# Patient Record
Sex: Female | Born: 1960 | Race: White | Hispanic: No | Smoking: Current every day smoker
Health system: Southern US, Community
[De-identification: ages and names within clinical notes are randomized; demographics above are authoritative.]

## PROBLEM LIST (undated history)

## (undated) DIAGNOSIS — M1990 Unspecified osteoarthritis, unspecified site: Secondary | ICD-10-CM

## (undated) DIAGNOSIS — F419 Anxiety disorder, unspecified: Secondary | ICD-10-CM

## (undated) DIAGNOSIS — E119 Type 2 diabetes mellitus without complications: Secondary | ICD-10-CM

## (undated) DIAGNOSIS — F32A Depression, unspecified: Secondary | ICD-10-CM

## (undated) DIAGNOSIS — E786 Lipoprotein deficiency: Secondary | ICD-10-CM

## (undated) DIAGNOSIS — K219 Gastro-esophageal reflux disease without esophagitis: Secondary | ICD-10-CM

## (undated) DIAGNOSIS — F329 Major depressive disorder, single episode, unspecified: Secondary | ICD-10-CM

## (undated) DIAGNOSIS — K7581 Nonalcoholic steatohepatitis (NASH): Secondary | ICD-10-CM

## (undated) DIAGNOSIS — N3941 Urge incontinence: Secondary | ICD-10-CM

## (undated) HISTORY — PX: CARPAL TUNNEL RELEASE: SHX101

## (undated) HISTORY — PX: OTHER SURGICAL HISTORY: SHX169

## (undated) HISTORY — PX: APPENDECTOMY: SHX54

---

## 2004-09-23 ENCOUNTER — Ambulatory Visit: Payer: Self-pay | Admitting: Family Medicine

## 2005-08-17 ENCOUNTER — Encounter: Payer: Self-pay | Admitting: Family Medicine

## 2005-08-21 ENCOUNTER — Encounter: Payer: Self-pay | Admitting: Family Medicine

## 2005-09-20 ENCOUNTER — Encounter: Payer: Self-pay | Admitting: Family Medicine

## 2006-05-06 ENCOUNTER — Ambulatory Visit: Payer: Self-pay | Admitting: Family Medicine

## 2008-05-07 ENCOUNTER — Ambulatory Visit: Payer: Self-pay | Admitting: Gastroenterology

## 2011-01-06 ENCOUNTER — Ambulatory Visit: Payer: Self-pay | Admitting: Family Medicine

## 2012-07-12 DIAGNOSIS — G5602 Carpal tunnel syndrome, left upper limb: Secondary | ICD-10-CM | POA: Insufficient documentation

## 2012-07-12 DIAGNOSIS — F321 Major depressive disorder, single episode, moderate: Secondary | ICD-10-CM | POA: Insufficient documentation

## 2012-07-12 DIAGNOSIS — E785 Hyperlipidemia, unspecified: Secondary | ICD-10-CM | POA: Insufficient documentation

## 2012-07-12 DIAGNOSIS — G4700 Insomnia, unspecified: Secondary | ICD-10-CM | POA: Insufficient documentation

## 2013-03-24 ENCOUNTER — Ambulatory Visit: Payer: Self-pay | Admitting: Gastroenterology

## 2013-12-26 ENCOUNTER — Ambulatory Visit: Payer: Self-pay | Admitting: Family Medicine

## 2013-12-26 IMAGING — MG MM DIGITAL SCREENING BILAT W/ CAD
1 series · 4 of 4 positions shown · non-contrast
Comparison: Previous exam(s).

CLINICAL DATA: Screening.

EXAM:
DIGITAL SCREENING BILATERAL MAMMOGRAM WITH CAD

[R CC · right · 4 of 4 slices shown]
[im 1/4]
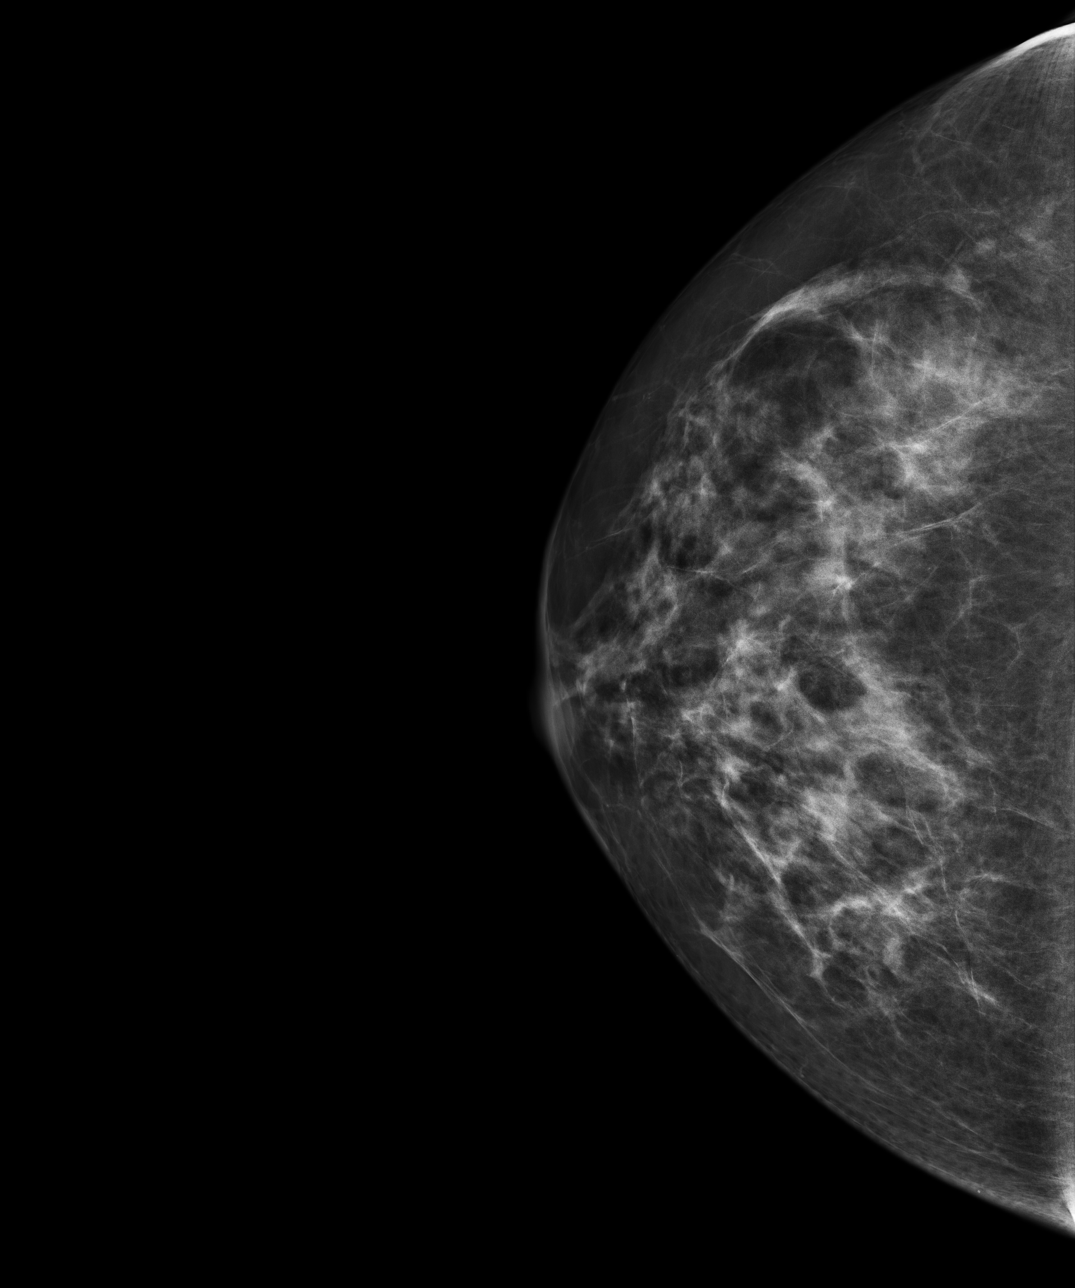
[im 2/4]
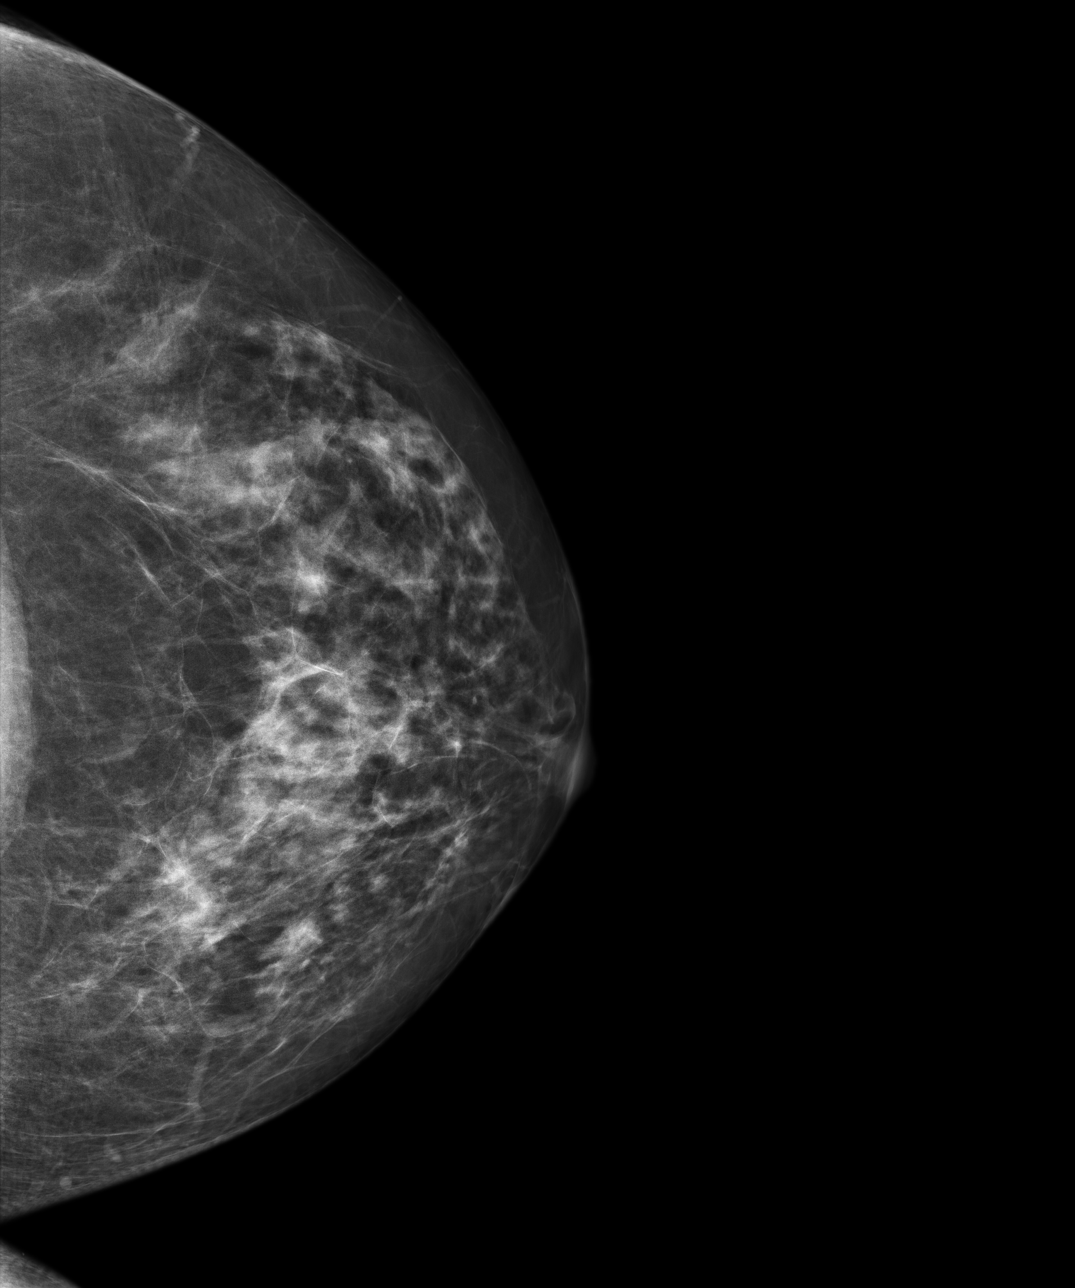
[im 3/4]
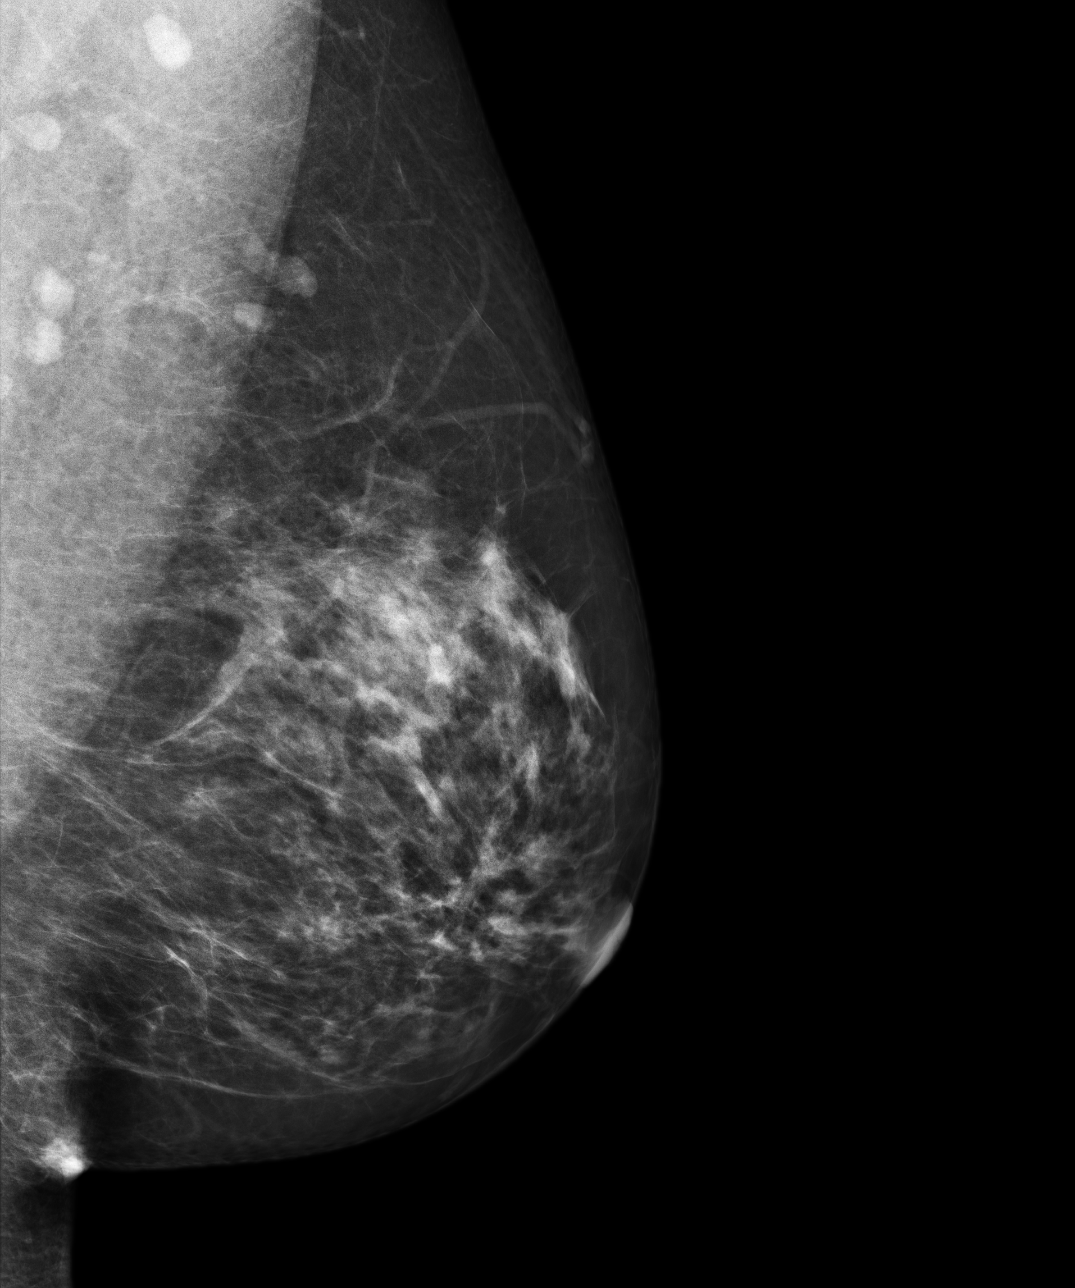
[im 4/4]
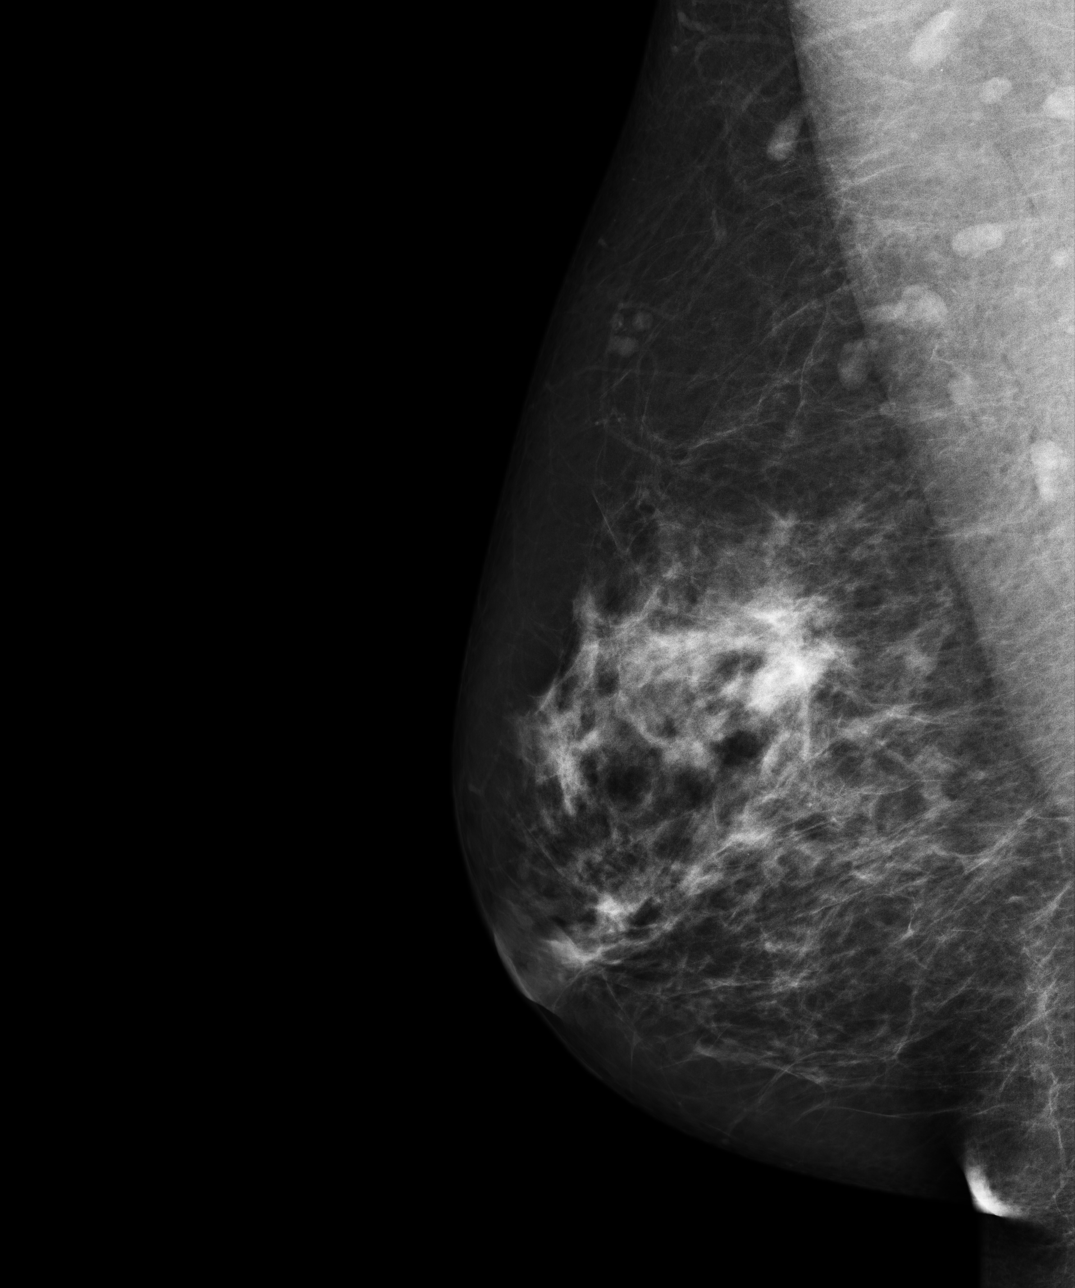

[4 of 4 positions shown; findings below may reference images not displayed]

ACR Breast Density Category b: There are scattered areas of
fibroglandular density.
FINDINGS: There are no findings suspicious for malignancy. Images were
processed with CAD.
IMPRESSION: No mammographic evidence of malignancy. A result letter of this
screening mammogram will be mailed directly to the patient.

RECOMMENDATION:
Screening mammogram in one year. (Code:[HN])

BI-RADS CATEGORY  1: Negative

## 2014-03-20 ENCOUNTER — Ambulatory Visit: Payer: Self-pay | Admitting: Obstetrics & Gynecology

## 2014-03-20 DIAGNOSIS — E7800 Pure hypercholesterolemia, unspecified: Secondary | ICD-10-CM

## 2014-03-20 LAB — CBC
HCT: 38.7 % (ref 35.0–47.0)
HGB: 13.2 g/dL (ref 12.0–16.0)
MCH: 33.1 pg (ref 26.0–34.0)
MCHC: 34 g/dL (ref 32.0–36.0)
MCV: 97 fL (ref 80–100)
Platelet: 242 10*3/uL (ref 150–440)
RBC: 3.97 10*6/uL (ref 3.80–5.20)
RDW: 12 % (ref 11.5–14.5)
WBC: 7.7 10*3/uL (ref 3.6–11.0)

## 2014-03-20 LAB — PROTIME-INR
INR: 0.9
Prothrombin Time: 11.8 secs (ref 11.5–14.7)

## 2014-03-20 LAB — APTT: ACTIVATED PTT: 29.2 s (ref 23.6–35.9)

## 2014-03-30 ENCOUNTER — Ambulatory Visit: Payer: Self-pay | Admitting: Obstetrics & Gynecology

## 2015-04-13 NOTE — Op Note (Signed)
PATIENT NAME:  Melanie Morales, Melanie Morales MR#:  [id number] DATE OF BIRTH:  March 23, 1961  DATE OF PROCEDURE:  03/30/2014  PREOPERATIVE DIAGNOSIS: Genuine stress urinary incontinence and cystocele.  POSTOPERATIVE DIAGNOSIS: Genuine stress urinary incontinence and cystocele.   PROCEDURE: Anterior colporrhaphy with vaginal sling procedure for incontinence and cystoscopy.   SURGEON: Glean Salen, MD  ANESTHESIA: General.   ESTIMATED BLOOD LOSS: Minimal.   COMPLICATIONS: None.   FINDINGS: Cystocele.   DISPOSITION: To recovery room stable.   TECHNIQUE: The patient is prepped and draped in the usual sterile fashion after adequate anesthesia is obtained in the dorsal lithotomy position. A Foley catheter is inserted. A speculum is placed, and the cystocele is visualized and identified. The cervix and uterus are identified with good distal support. Allis clamps are placed along the vaginal anterior wall midline, and then 1% lidocaine with epinephrine is used to infiltrate the midline space. A scalpel is used to create an incision in the vaginal mucosa, and then the dissection is carried out in a vertical superior to inferior fashion using Metzenbaum scissors. The endopelvic fascia is dissected away from the vaginal mucosa with careful dissection towards the retropubic space. Once adequate dissection is performed, then the Foley catheter bag is removed from the Foley, and a rigid catheter guide is placed through the Foley to deviate the bladder in a contralateral position from where the trocars are placed for the vaginal sling. Using a long spinal needle, 1% lidocaine with epinephrine is used to infiltrate the area from the pubic ramus up to the skin in the suprapubic mons region for placement of trocars. Using the TVT Exact, trocar is placed vaginally through the dissected space into the retropubic space and then guided upwards towards the exit point previously identified. A skin nick with a scalpel is used to pull  the trocar through. This is performed on the contralateral side as well.   Cystoscopy is performed with saline distention of the bladder. The catheter and rigid guide are removed as the cystoscopy is placed, and 250 mL is infiltrated into the bladder. Visualization reveals no injury or trocar perforation of the bladder. The cystoscope was removed, but the fluid is left in the bladder at this time.   The trocars are pulled superiorly and anteriorly so that the sling is placed up against the bladder neck in the vaginal dissection site. Tension is placed, and then Valsalva with suprapubic pressure is performed until minimal leakage is performed. It is carefully noted not to completely obstruct the urethra, and a hemostat is easily able to be placed between the tension-free tape sling and the tissues. The sheath over the sling is then removed, and the sling is cut off at the level of the skin in the mons pubis area, and the skin is closed with Dermabond.   Foley catheter is reinserted. Colporrhaphy sutures are applied with 0 Vicryls, incorporating the endopelvic fascia to overlie the sling as well as in this anterior vaginal wall area to strengthen the fascia. Excess vaginal mucosa is excised. The vaginal mucosa is then closed with a running 2-0 Vicryl suture in a locking fashion. Vaginal cavity is irrigated with saline. A packing sponge with bacitracin ointment applied is then placed vaginally until the patient can get to the recovery area. The patient has a Foley catheter as she goes to recovery room, with later plans for a voiding trial. The patient tolerated the procedure well and goes to recovery room in stable condition. All sponge, instrument and needle  counts are correct at the conclusion of the case.     ____________________________ R. Barnett Applebaum, MD rph:lb D: 03/30/2014 11:32:22 ET T: 03/30/2014 11:40:25 ET JOB#: [id number]  cc: Glean Salen, MD, <Dictator> Gae Dry MD ELECTRONICALLY  SIGNED 03/31/2014 8:26

## 2015-04-17 ENCOUNTER — Other Ambulatory Visit: Payer: Self-pay | Admitting: Family Medicine

## 2015-04-17 DIAGNOSIS — Z1231 Encounter for screening mammogram for malignant neoplasm of breast: Secondary | ICD-10-CM

## 2015-04-17 DIAGNOSIS — Z13820 Encounter for screening for osteoporosis: Secondary | ICD-10-CM

## 2015-05-07 ENCOUNTER — Ambulatory Visit
Admission: RE | Admit: 2015-05-07 | Discharge: 2015-05-07 | Disposition: A | Discharge status: Home / Self Care | Payer: Private Health Insurance - Indemnity | Source: Ambulatory Visit | Attending: Family Medicine | Admitting: Family Medicine

## 2015-05-07 DIAGNOSIS — Z13820 Encounter for screening for osteoporosis: Secondary | ICD-10-CM

## 2015-05-07 DIAGNOSIS — Z1231 Encounter for screening mammogram for malignant neoplasm of breast: Secondary | ICD-10-CM | POA: Insufficient documentation

## 2015-05-07 IMAGING — MG MM DIGITAL SCREENING BILAT W/ CAD
4 series · 4 of 4 positions shown · non-contrast
Comparison: Previous exam(s).

CLINICAL DATA: Screening.

EXAM:
DIGITAL SCREENING BILATERAL MAMMOGRAM WITH CAD

[L CC]
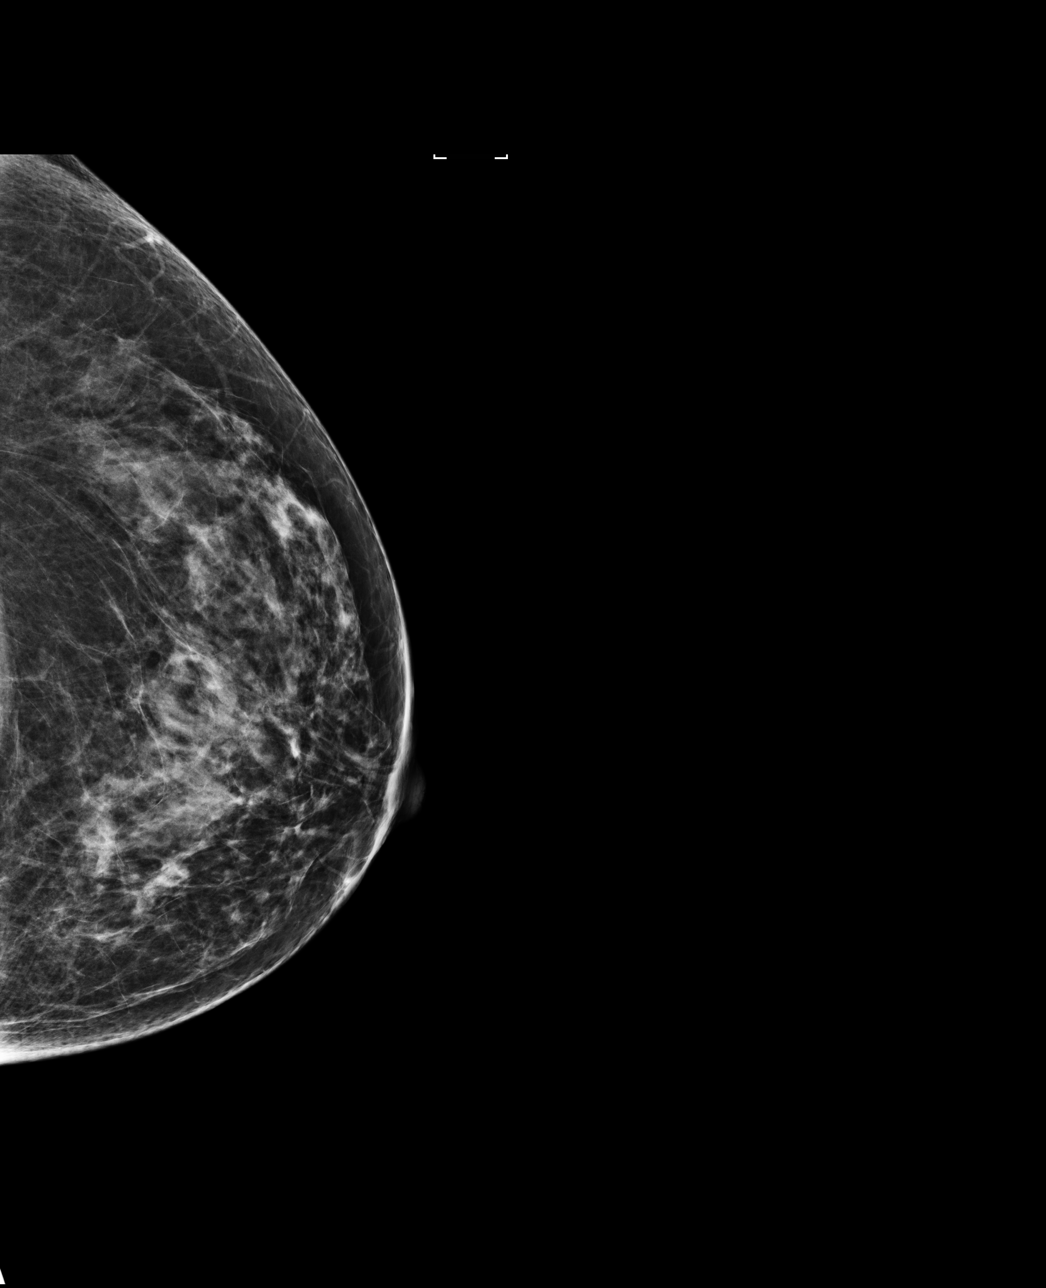

[R CC]
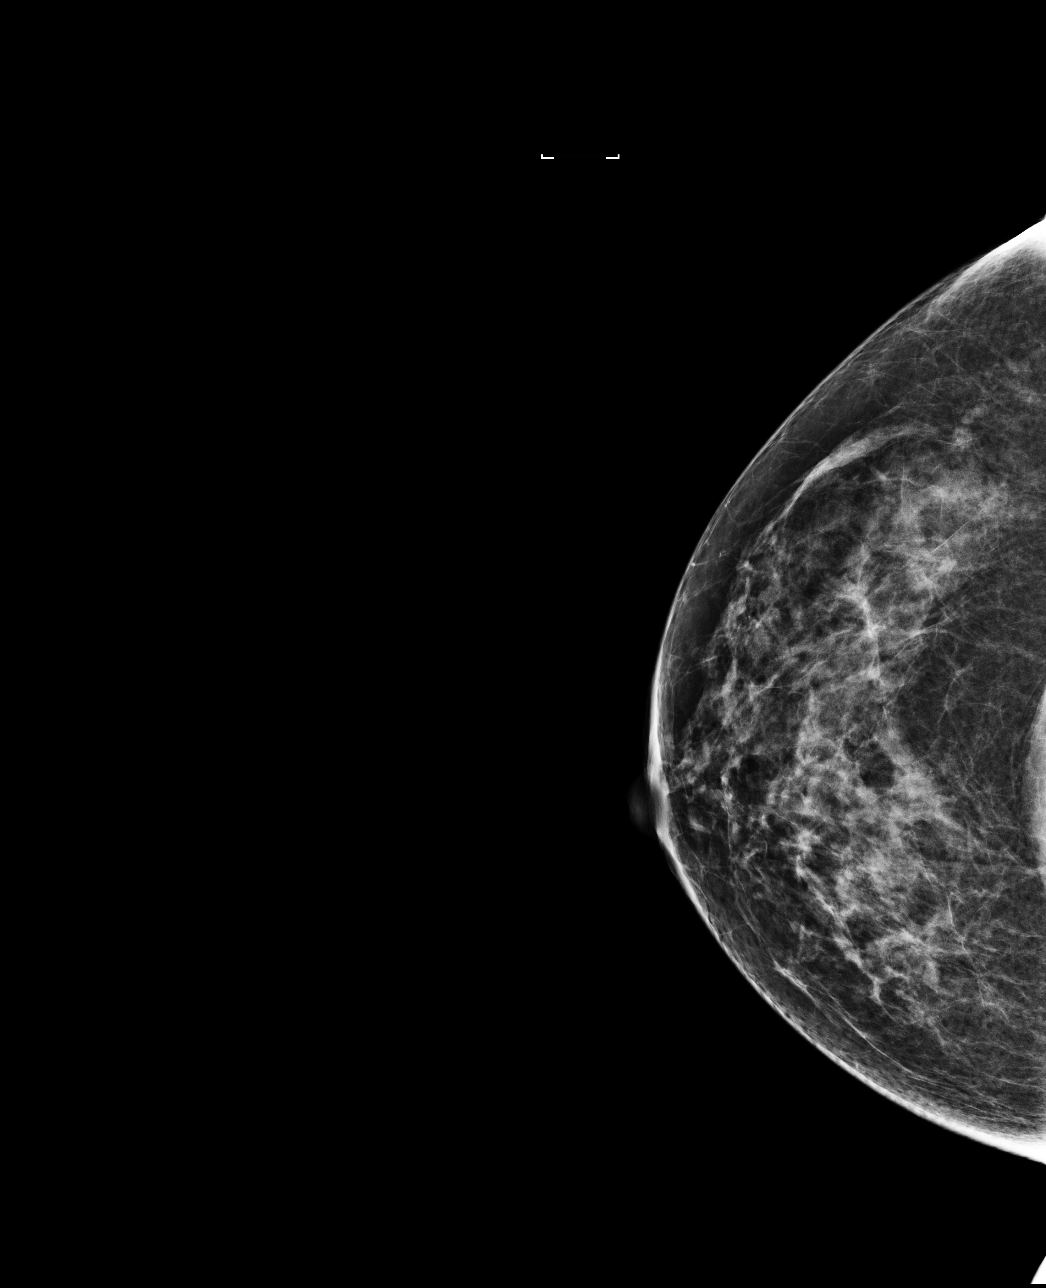

[R MLO]
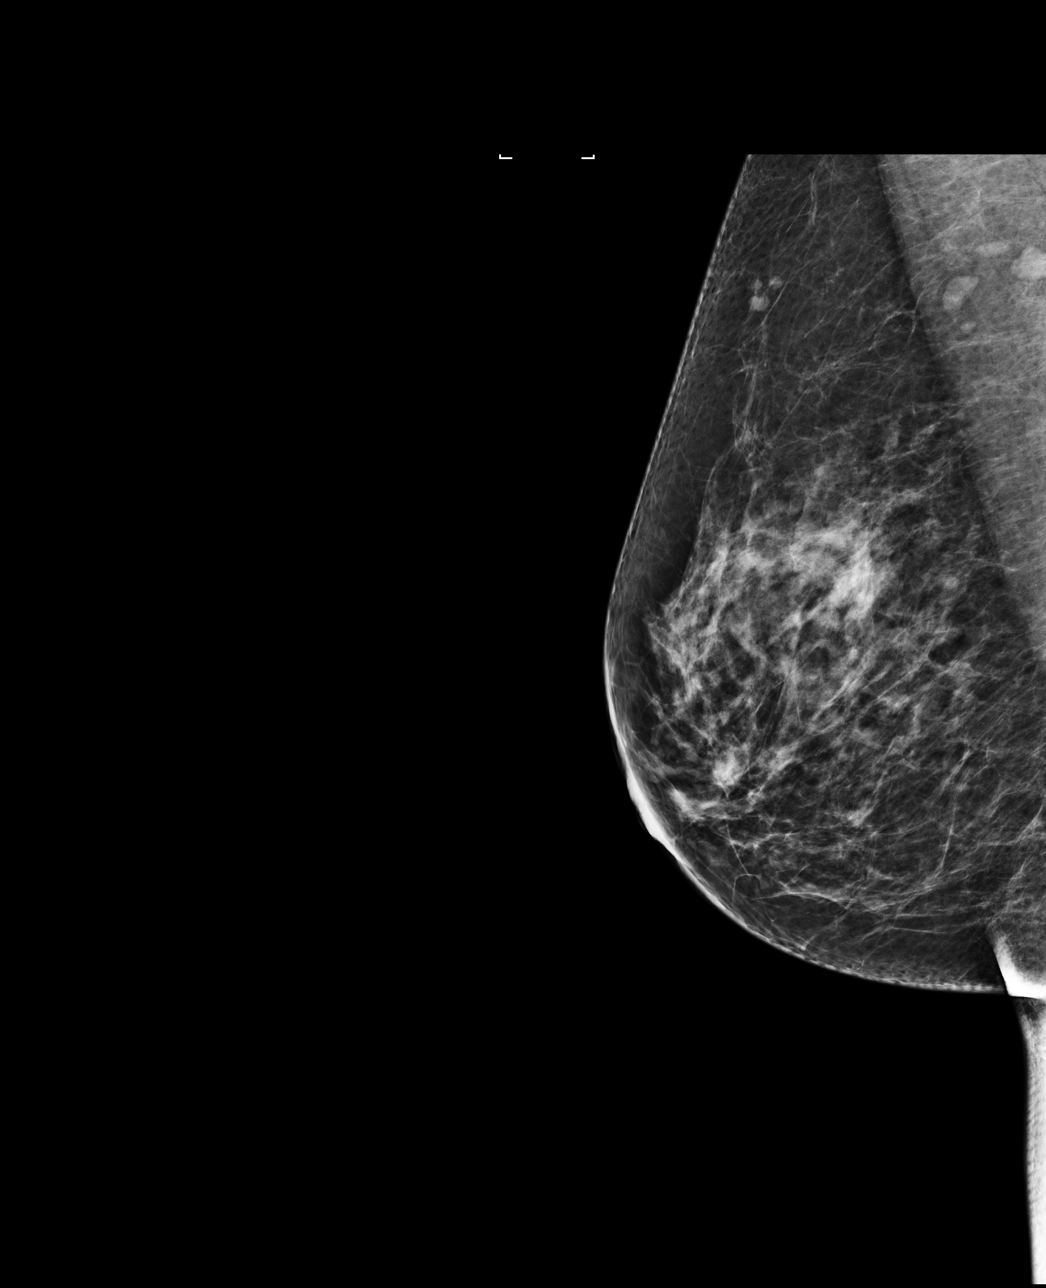

[L MLO]
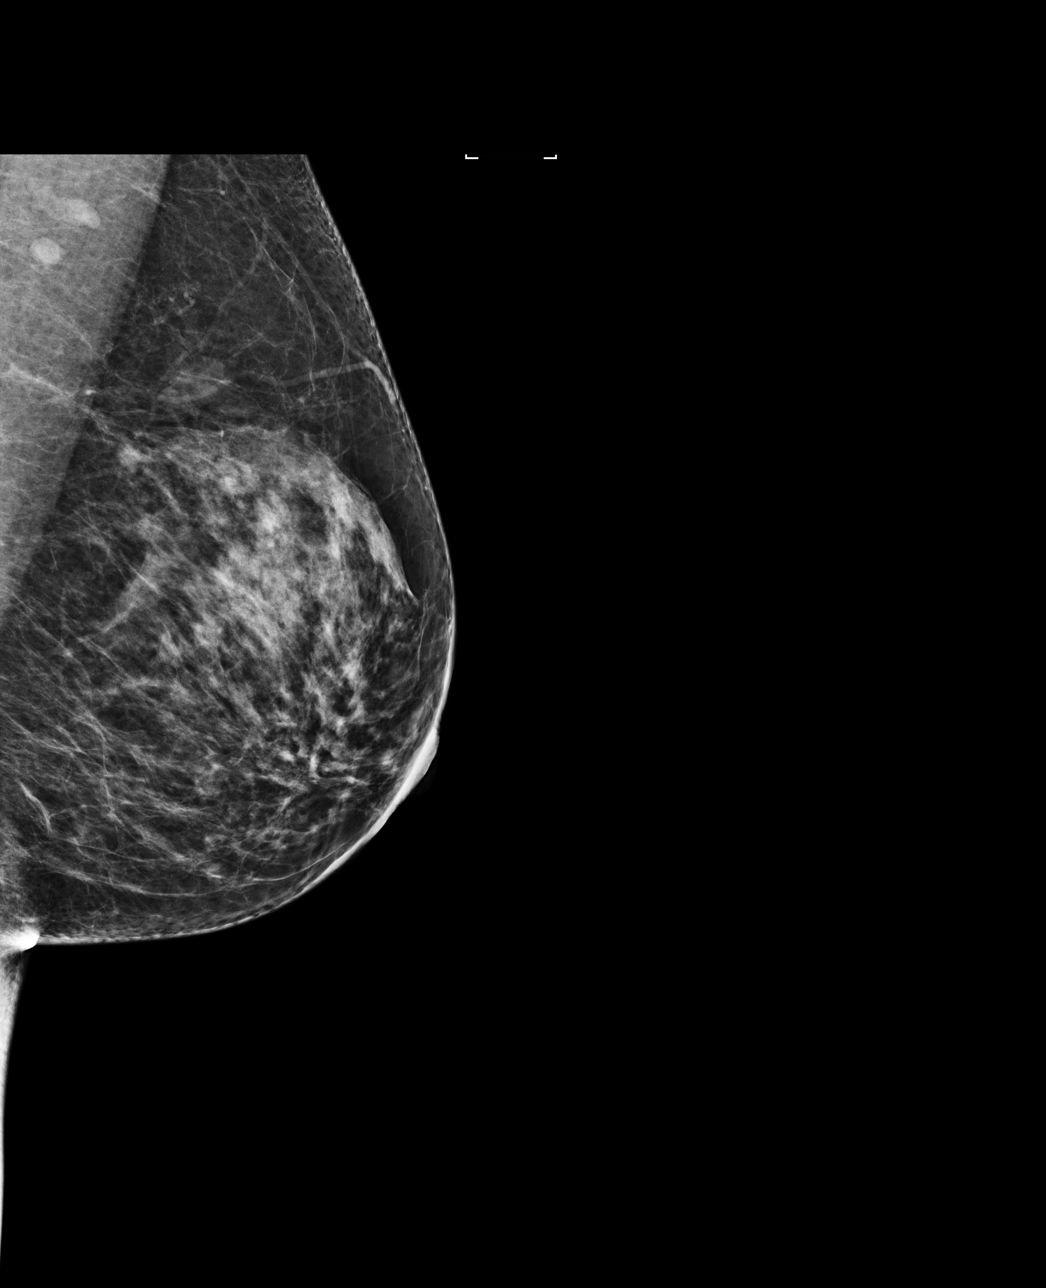

[4 of 4 positions shown; findings below may reference images not displayed]

ACR Breast Density Category c: The breast tissue is heterogeneously
dense, which may obscure small masses.
FINDINGS: There are no findings suspicious for malignancy. Images were
processed with CAD.
IMPRESSION: No mammographic evidence of malignancy. A result letter of this
screening mammogram will be mailed directly to the patient.

RECOMMENDATION:
Screening mammogram in one year. (Code:[0J])

BI-RADS CATEGORY  1: Negative.

## 2015-07-25 ENCOUNTER — Encounter: Payer: Self-pay | Admitting: *Deleted

## 2015-07-26 ENCOUNTER — Encounter: Payer: Self-pay | Admitting: *Deleted

## 2015-07-26 ENCOUNTER — Ambulatory Visit
Admission: RE | Admit: 2015-07-26 | Discharge: 2015-07-26 | Disposition: A | Discharge status: Home / Self Care | Payer: Managed Care, Other (non HMO) | Source: Ambulatory Visit | Attending: Gastroenterology | Admitting: Gastroenterology

## 2015-07-26 ENCOUNTER — Ambulatory Visit: Payer: Managed Care, Other (non HMO) | Admitting: Certified Registered Nurse Anesthetist

## 2015-07-26 ENCOUNTER — Encounter
Admission: RE | Disposition: A | Discharge status: Home / Self Care | Payer: Self-pay | Source: Ambulatory Visit | Attending: Gastroenterology

## 2015-07-26 DIAGNOSIS — Z800 Family history of malignant neoplasm of digestive organs: Secondary | ICD-10-CM | POA: Diagnosis not present

## 2015-07-26 DIAGNOSIS — K219 Gastro-esophageal reflux disease without esophagitis: Secondary | ICD-10-CM | POA: Diagnosis present

## 2015-07-26 DIAGNOSIS — Z7951 Long term (current) use of inhaled steroids: Secondary | ICD-10-CM | POA: Insufficient documentation

## 2015-07-26 DIAGNOSIS — Z79899 Other long term (current) drug therapy: Secondary | ICD-10-CM | POA: Diagnosis not present

## 2015-07-26 DIAGNOSIS — E119 Type 2 diabetes mellitus without complications: Secondary | ICD-10-CM | POA: Insufficient documentation

## 2015-07-26 DIAGNOSIS — F329 Major depressive disorder, single episode, unspecified: Secondary | ICD-10-CM | POA: Diagnosis not present

## 2015-07-26 DIAGNOSIS — K2980 Duodenitis without bleeding: Secondary | ICD-10-CM | POA: Insufficient documentation

## 2015-07-26 HISTORY — DX: Major depressive disorder, single episode, unspecified: F32.9

## 2015-07-26 HISTORY — DX: Lipoprotein deficiency: E78.6

## 2015-07-26 HISTORY — DX: Gastro-esophageal reflux disease without esophagitis: K21.9

## 2015-07-26 HISTORY — DX: Depression, unspecified: F32.A

## 2015-07-26 HISTORY — PX: ESOPHAGOGASTRODUODENOSCOPY (EGD) WITH PROPOFOL: SHX5813

## 2015-07-26 LAB — GLUCOSE, CAPILLARY: GLUCOSE-CAPILLARY: 88 mg/dL (ref 65–99)

## 2015-07-26 LAB — POCT PREGNANCY, URINE: Preg Test, Ur: NEGATIVE

## 2015-07-26 SURGERY — ESOPHAGOGASTRODUODENOSCOPY (EGD) WITH PROPOFOL
Anesthesia: General

## 2015-07-26 MED ORDER — PROPOFOL INFUSION 10 MG/ML OPTIME
INTRAVENOUS | Status: DC | PRN
  Administered 2015-07-26: 100 ug/kg/min via INTRAVENOUS

## 2015-07-26 MED ORDER — SODIUM CHLORIDE 0.9 % IV SOLN
INTRAVENOUS | Status: DC
  Administered 2015-07-26: 1000 mL via INTRAVENOUS

## 2015-07-26 MED ORDER — MIDAZOLAM HCL 2 MG/2ML IJ SOLN
INTRAMUSCULAR | Status: DC | PRN
  Administered 2015-07-26: 1 mg via INTRAVENOUS

## 2015-07-26 MED ORDER — PROPOFOL 10 MG/ML IV BOLUS
INTRAVENOUS | Status: DC | PRN
  Administered 2015-07-26: 20 mg via INTRAVENOUS

## 2015-07-26 MED ORDER — GLYCOPYRROLATE 0.2 MG/ML IJ SOLN
INTRAMUSCULAR | Status: DC | PRN
  Administered 2015-07-26: 0.2 mg via INTRAVENOUS

## 2015-07-26 MED ORDER — LIDOCAINE HCL (CARDIAC) 20 MG/ML IV SOLN: INTRAVENOUS | Status: DC | PRN

## 2015-07-26 MED ORDER — SODIUM CHLORIDE 0.9 % IV SOLN: INTRAVENOUS | Status: DC

## 2015-07-26 MED ORDER — FENTANYL CITRATE (PF) 100 MCG/2ML IJ SOLN
INTRAMUSCULAR | Status: DC | PRN
  Administered 2015-07-26: 25 ug via INTRAVENOUS

## 2015-07-26 MED ORDER — PROPOFOL 10 MG/ML IV BOLUS: INTRAVENOUS | Status: DC | PRN

## 2015-07-26 MED ORDER — PROPOFOL INFUSION 10 MG/ML OPTIME: INTRAVENOUS | Status: DC | PRN

## 2015-07-26 MED ORDER — LIDOCAINE HCL (CARDIAC) 20 MG/ML IV SOLN
INTRAVENOUS | Status: DC | PRN
  Administered 2015-07-26: 100 mg via INTRAVENOUS

## 2015-07-26 NOTE — Anesthesia Postprocedure Evaluation (Signed)
  Anesthesia Post-op Note  Patient: Melanie Morales  Procedure(s) Performed: Procedure(s): ESOPHAGOGASTRODUODENOSCOPY (EGD) WITH PROPOFOL (N/A)  Anesthesia type:General  Patient location: PACU  Post pain: Pain level controlled  Post assessment: Post-op Vital signs reviewed, Patient's Cardiovascular Status Stable, Respiratory Function Stable, Patent Airway and No signs of Nausea or vomiting  Post vital signs: Reviewed and stable  Last Vitals:  Filed Vitals:   07/26/15 1237  BP: 121/64  Pulse: 79  Temp: 36.3 C  Resp: 16    Level of consciousness: awake, alert  and patient cooperative  Complications: No apparent anesthesia complications

## 2015-07-26 NOTE — Op Note (Signed)
[facility] Gastroenterology Patient Name: Biviana Saddler Procedure Date: 07/26/2015 11:56 AM MRN: [id number] Account #: [id number] Date of Birth: 06-01-1961 Admit Type: Outpatient Age: 54 Room: [facility] ENDO ROOM 3 Gender: Female Note Status: Finalized Procedure:         Upper GI endoscopy Indications:       Gastro-esophageal reflux disease, Follow-up of Barrett's                     esophagus Providers:         Lollie Sails, MD Referring MD:      Truitt Leep. Ingledue (Referring MD) Medicines:         Monitored Anesthesia Care Complications:     No immediate complications. Procedure:         Pre-Anesthesia Assessment:                    - ASA Grade Assessment: II - A patient with mild systemic                     disease.                    After obtaining informed consent, the endoscope was passed                     under direct vision. Throughout the procedure, the                     patient's blood pressure, pulse, and oxygen saturations                     were monitored continuously. The Olympus GIF-160 endoscope                     (S#. [id number]) was introduced through the mouth, and                     advanced to the fourth part of duodenum. The upper GI                     endoscopy was accomplished without difficulty. The patient                     tolerated the procedure well. Findings:      The Z-line was variable and was found 39 cm from the incisors. Biopsies       were taken with a cold forceps for histology.      Tongues of salmon-colored mucosa were present. The maximum longitudinal       extent of these esophageal mucosal changes was 1 cm in length.      The exam of the esophagus was otherwise normal.      The entire examined stomach was normal.      The cardia and gastric fundus were normal on retroflexion.      Patchy mild mucosal variance characterized by texture change was found       in the second part of the duodenum and in the third  part of the       duodenum. Biopsies were taken with a cold forceps for histology. Impression:        - Z-line variable, 39 cm from the incisors. Biopsied.                    - Salmon-colored  mucosa suggestive of short-segment                     Barrett's esophagus.                    - Normal stomach.                    - Mucosal variant in the duodenum. Biopsied. Recommendation:    - Return to GI clinic in 1 month.                    - Discharge patient to home.                    - Continue present medications. Procedure Code(s): --- Professional ---                    [phone], Esophagogastroduodenoscopy, flexible, transoral;                     with biopsy, single or multiple Diagnosis Code(s): --- Professional ---                    530.89, Other specified disorders of esophagus                    530.85, Barrett's esophagus                    537.9, Unspecified disorder of stomach and duodenum                    530.81, Esophageal reflux CPT copyright 2014 American Medical Association. All rights reserved. The codes documented in this report are preliminary and upon coder review may  be revised to meet current compliance requirements. Lollie Sails, MD 07/26/2015 12:37:49 PM This report has been signed electronically. Number of Addenda: 0 Note Initiated On: 07/26/2015 11:56 AM      [facility]

## 2015-07-26 NOTE — Transfer of Care (Signed)
Immediate Anesthesia Transfer of Care Note  Patient: Melanie Morales  Procedure(s) Performed: Procedure(s): ESOPHAGOGASTRODUODENOSCOPY (EGD) WITH PROPOFOL (N/A)  Patient Location: PACU  Anesthesia Type:General  Level of Consciousness: awake, alert  and oriented  Airway & Oxygen Therapy: Patient Spontanous Breathing  Post-op Assessment: Report given to RN and Post -op Vital signs reviewed and stable  Post vital signs: Reviewed, stable   Last Vitals:  Filed Vitals:   07/26/15 1236  BP: 121/64  Pulse: 77  Temp: 36.3 C  Resp: 14    Complications: No apparent anesthesia complications

## 2015-07-26 NOTE — H&P (Addendum)
Outpatient short stay form Pre-procedure 07/26/2015 12:01 PM Melanie Sails MD  Primary Physician: Dr. Kirkland Hun  Reason for visit:  EGD  History of present illness:  Melanie Morales is a 54 year old female with a history of an EGD being done in May 2009. At that time she was told she had Barrett's esophagus. Had a repeat EGD since that time. She has no dysphagia does take omeprazole 20 mg daily.  She does have a primary relative, her brother, who did have esophageal cancer at the age of about 60.    Current facility-administered medications:  .  0.9 %  sodium chloride infusion, , Intravenous, Continuous, Melanie Sails, MD, Last Rate: 20 mL/hr at 07/26/15 1052, 1,000 mL at 07/26/15 1052 .  0.9 %  sodium chloride infusion, , Intravenous, Continuous, Melanie Sails, MD .  0.9 %  sodium chloride infusion, , Intravenous, Continuous, Melanie Sails, MD  Facility-Administered Medications Ordered in Other Encounters:  .  glycopyrrolate (ROBINUL) injection, , , Anesthesia Intra-op, Demetrius Charity, CRNA, 0.2 mg at 07/26/15 1142  Prescriptions prior to admission  Medication Sig Dispense Refill Last Dose  . amitriptyline (ELAVIL) 100 MG tablet Take 100 mg by mouth at bedtime.   07/25/2015 at Unknown time  . FLUoxetine (PROZAC) 20 MG capsule Take 20 mg by mouth daily.   07/25/2015 at Unknown time  . fluticasone (FLONASE) 50 MCG/ACT nasal spray Place 1 spray into both nostrils daily.   07/25/2015 at Unknown time  . lovastatin (MEVACOR) 40 MG tablet Take 40 mg by mouth at bedtime.   07/25/2015 at Unknown time  . metFORMIN (GLUCOPHAGE-XR) 500 MG 24 hr tablet Take 500 mg by mouth daily with breakfast.   07/25/2015 at Unknown time  . omeprazole (PRILOSEC) 20 MG capsule Take 20 mg by mouth daily.   07/25/2015 at Unknown time  . acetaminophen-codeine (TYLENOL #3) 300-30 MG per tablet Take by mouth every 4 (four) hours as needed for moderate pain.   Not Taking at Unknown time  . pravastatin (PRAVACHOL) 20 MG  tablet Take 20 mg by mouth daily.   Not Taking at Unknown time     No Known Allergies   Past Medical History  Diagnosis Date  . 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 GERD (gastroesophageal reflux disease)   . Depression     Review of systems:      Physical Exam    Heart and lungs: Regular rate and rhythm without rub or gallop lungs are bilaterally clear    HEENT: Normocephalic atraumatic eyes are anicteric    Other:     Pertinant exam for procedure: Soft nontender nondistended bowel sounds positive normoactive    Planned proceedures: EGD and indicated procedures    Melanie Sails, MD Gastroenterology 07/26/2015  12:01 PM      I have discussed the risks benefits and complications of procedures to include not limited to bleeding, infection, perforation and the risk of sedation and the patient wishes to proceed.

## 2015-07-26 NOTE — Anesthesia Preprocedure Evaluation (Addendum)
Anesthesia Evaluation  Patient identified by MRN, date of birth, ID band Patient awake    Reviewed: Allergy & Precautions, NPO status , Patient's Chart, lab work & pertinent test results  Airway Mallampati: II  TM Distance: >3 FB Neck ROM: Full    Dental  (+) Teeth Intact   Pulmonary Current Smoker,    Pulmonary exam normal       Cardiovascular Exercise Tolerance: Good Normal cardiovascular exam    Neuro/Psych    GI/Hepatic GERD-  Medicated,Hx of Barrett's esophagus.   Endo/Other  diabetes, Type 2, Oral Hypoglycemic AgentsBG 88.  Renal/GU      Musculoskeletal   Abdominal   Peds  Hematology   Anesthesia Other Findings   Reproductive/Obstetrics                           Anesthesia Physical Anesthesia Plan  ASA: III  Anesthesia Plan: General   Post-op Pain Management:    Induction: Intravenous  Airway Management Planned: Nasal Cannula  Additional Equipment:   Intra-op Plan:   Post-operative Plan:   Informed Consent: I have reviewed the patients History and Physical, chart, labs and discussed the procedure including the risks, benefits and alternatives for the proposed anesthesia with the patient or authorized representative who has indicated his/her understanding and acceptance.     Plan Discussed with: CRNA  Anesthesia Plan Comments:         Anesthesia Quick Evaluation

## 2015-07-26 NOTE — Anesthesia Procedure Notes (Signed)
Performed by: Ziomara Birenbaum Pre-anesthesia Checklist: Patient identified, Emergency Drugs available, Suction available, Patient being monitored and Timeout performed Patient Re-evaluated:Patient Re-evaluated prior to inductionOxygen Delivery Method: Nasal cannula Intubation Type: IV induction       

## 2015-07-27 ENCOUNTER — Encounter: Payer: Self-pay | Admitting: Gastroenterology

## 2015-07-30 LAB — SURGICAL PATHOLOGY

## 2016-04-01 ENCOUNTER — Other Ambulatory Visit: Payer: Self-pay | Admitting: Family Medicine

## 2016-04-01 DIAGNOSIS — Z1231 Encounter for screening mammogram for malignant neoplasm of breast: Secondary | ICD-10-CM

## 2016-05-07 ENCOUNTER — Ambulatory Visit
Admission: RE | Admit: 2016-05-07 | Discharge: 2016-05-07 | Disposition: A | Discharge status: Home / Self Care | Payer: Managed Care, Other (non HMO) | Source: Ambulatory Visit | Attending: Family Medicine | Admitting: Family Medicine

## 2016-05-07 DIAGNOSIS — Z1231 Encounter for screening mammogram for malignant neoplasm of breast: Secondary | ICD-10-CM | POA: Diagnosis not present

## 2016-05-07 IMAGING — MG MM DIGITAL SCREENING BILAT W/ CAD
4 series · 4 of 4 positions shown · non-contrast
Comparison: Previous exam(s).

CLINICAL DATA: Screening.

EXAM:
DIGITAL SCREENING BILATERAL MAMMOGRAM WITH CAD

[R MLO]
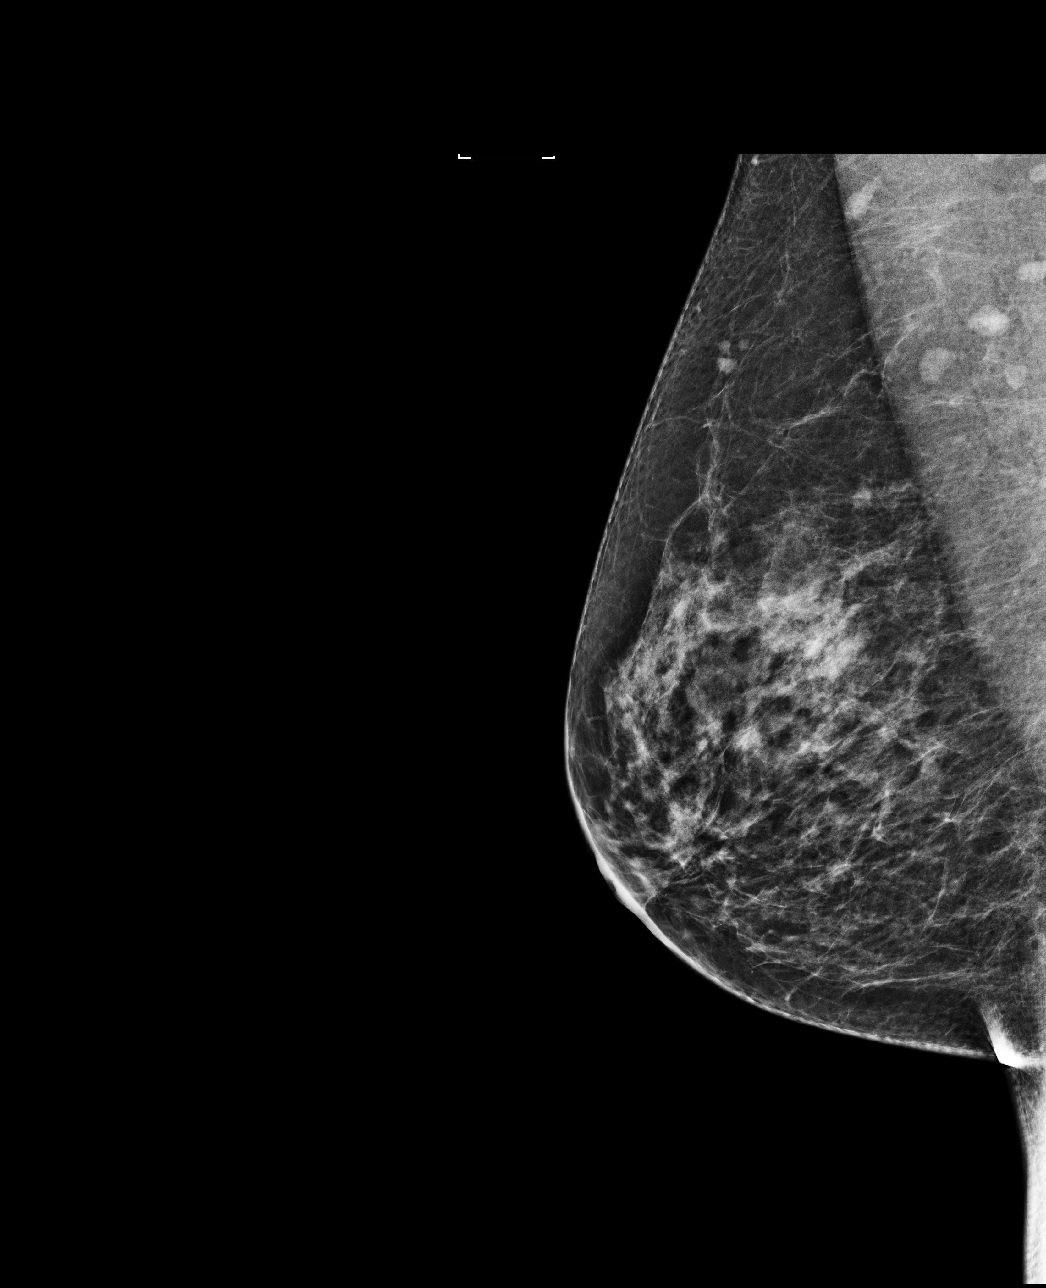

[L CC]
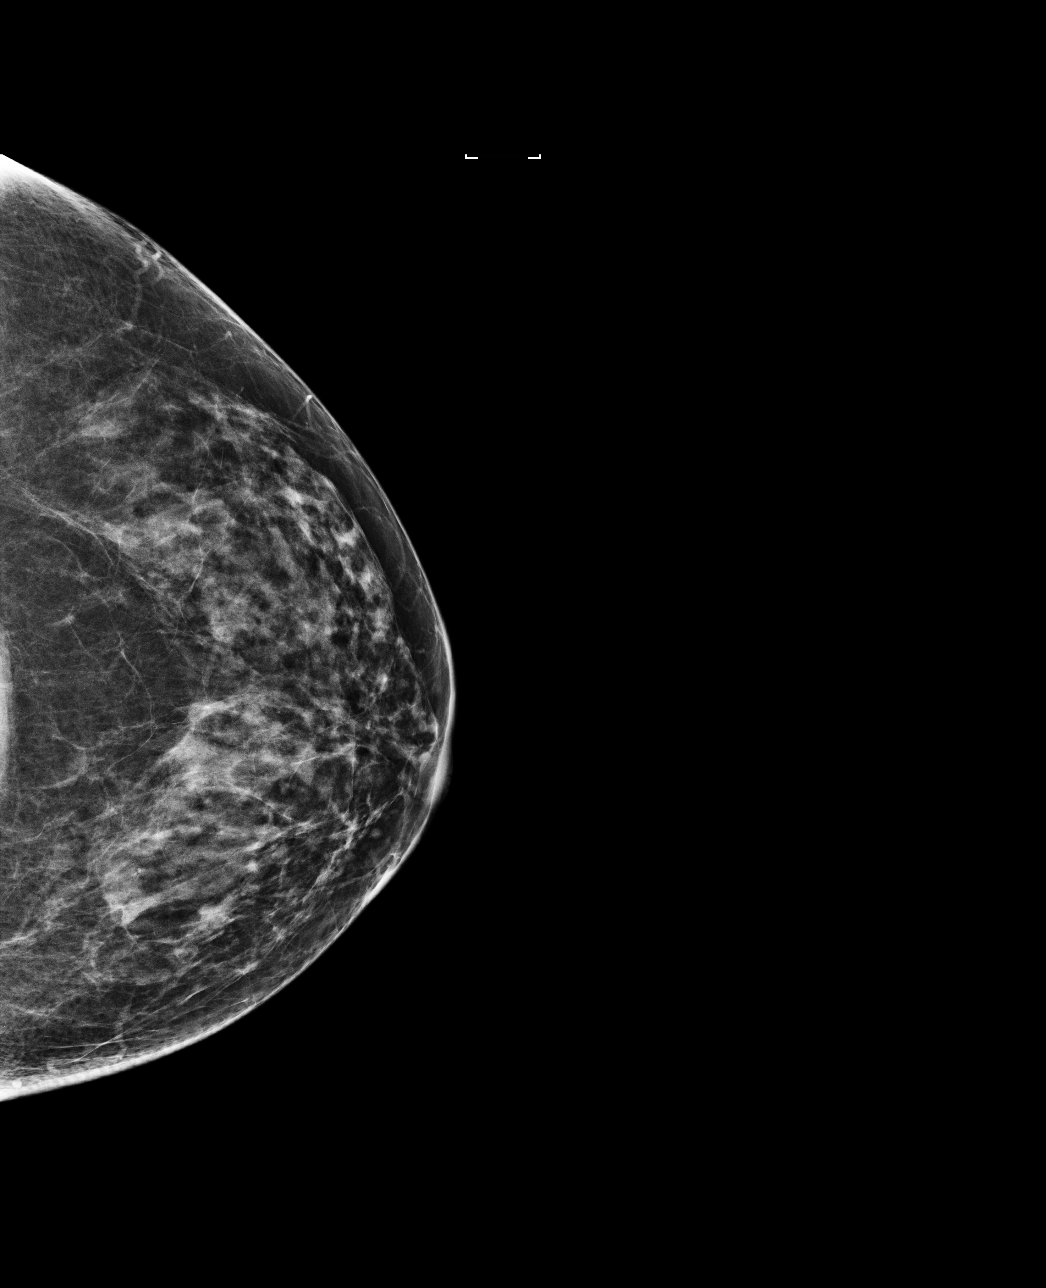

[L MLO]
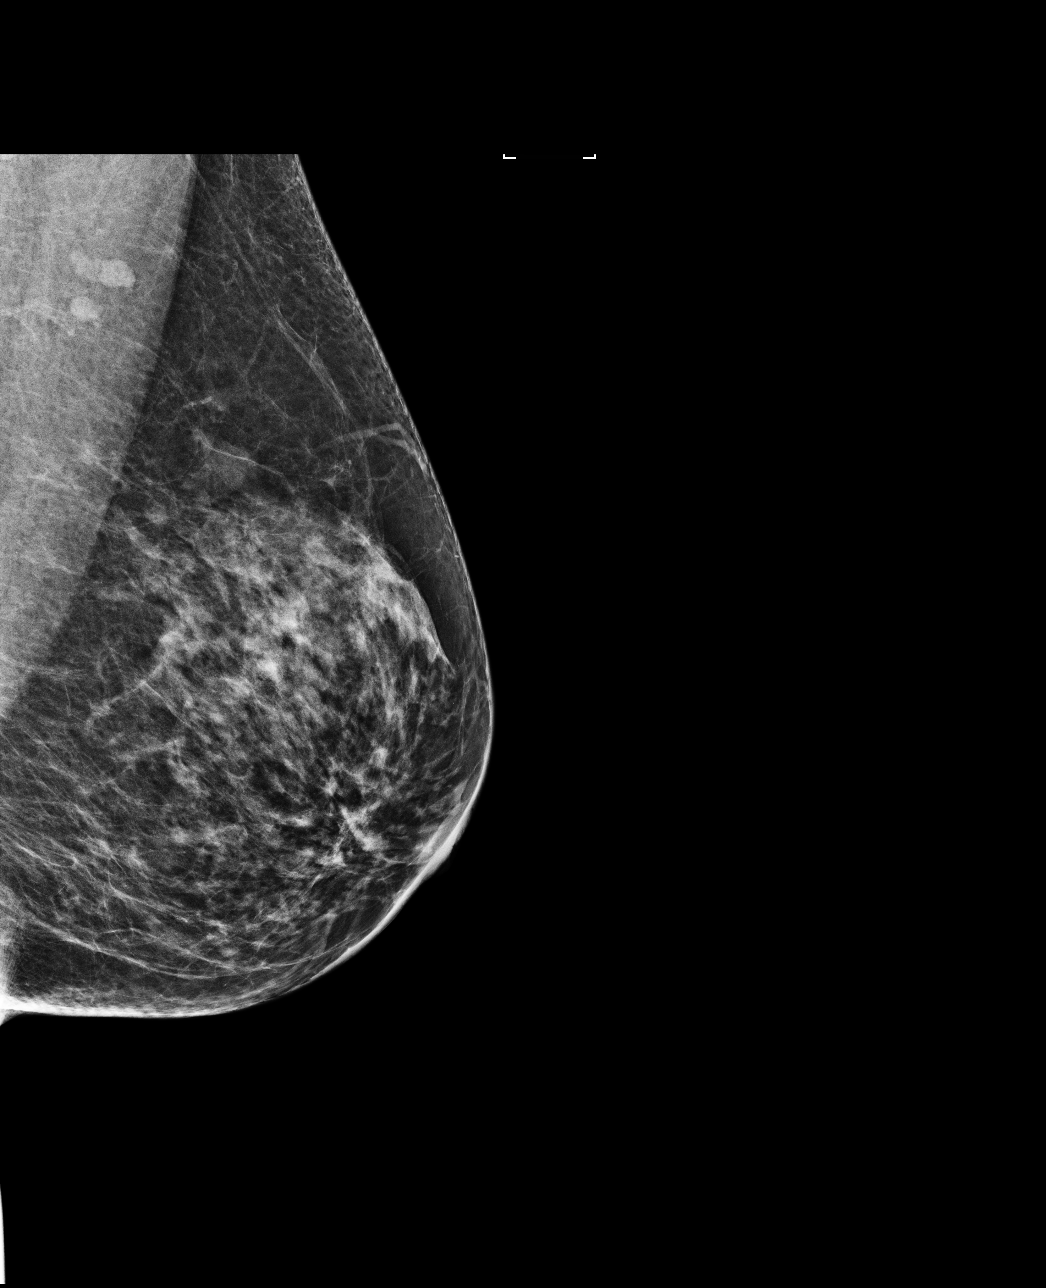

[R CC]
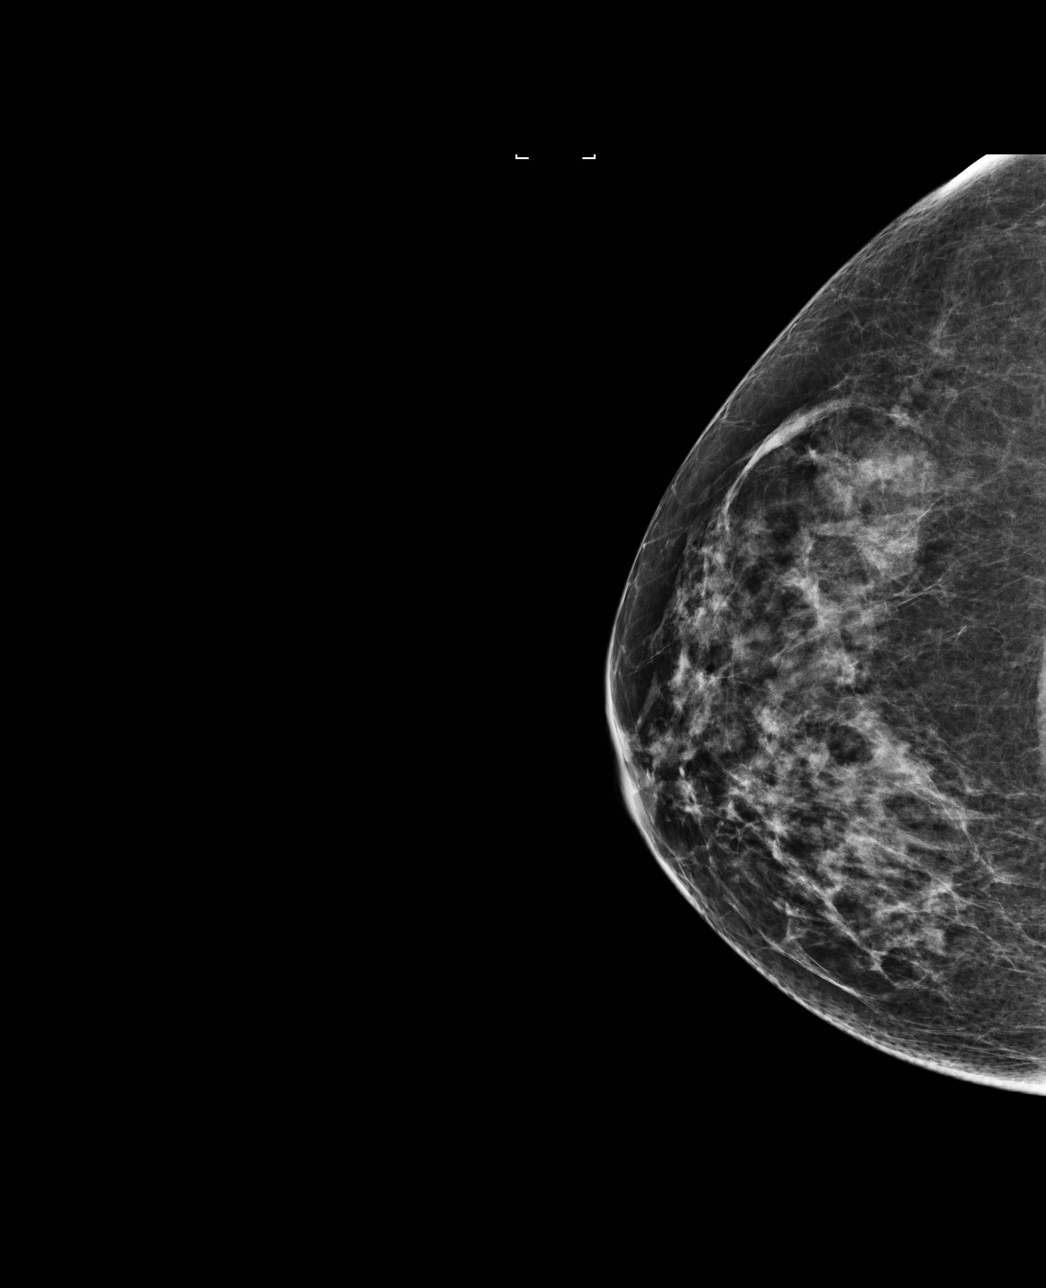

[4 of 4 positions shown; findings below may reference images not displayed]

ACR Breast Density Category c: The breast tissue is heterogeneously
dense, which may obscure small masses.
FINDINGS: There are no findings suspicious for malignancy. Images were
processed with CAD.
IMPRESSION: No mammographic evidence of malignancy. A result letter of this
screening mammogram will be mailed directly to the patient.

RECOMMENDATION:
Screening mammogram in one year. (Code:[0J])

BI-RADS CATEGORY  1: Negative.

## 2017-06-15 ENCOUNTER — Other Ambulatory Visit: Payer: Self-pay | Admitting: Pediatrics

## 2017-06-15 DIAGNOSIS — Z1231 Encounter for screening mammogram for malignant neoplasm of breast: Secondary | ICD-10-CM

## 2017-09-23 ENCOUNTER — Encounter: Payer: Self-pay | Admitting: *Deleted

## 2017-09-23 ENCOUNTER — Ambulatory Visit
Admission: RE | Admit: 2017-09-23 | Discharge: 2017-09-23 | Disposition: A | Discharge status: Home / Self Care | Payer: Commercial Managed Care - PPO | Source: Ambulatory Visit | Attending: Pediatrics | Admitting: Pediatrics

## 2017-09-23 DIAGNOSIS — Z1231 Encounter for screening mammogram for malignant neoplasm of breast: Secondary | ICD-10-CM | POA: Insufficient documentation

## 2017-09-23 IMAGING — MG MM DIGITAL SCREENING BILAT W/ CAD
5 series · 5 of 5 positions shown · non-contrast
Comparison: Previous exam(s).

CLINICAL DATA: Screening.

EXAM:
DIGITAL SCREENING BILATERAL MAMMOGRAM WITH CAD

[L CC]
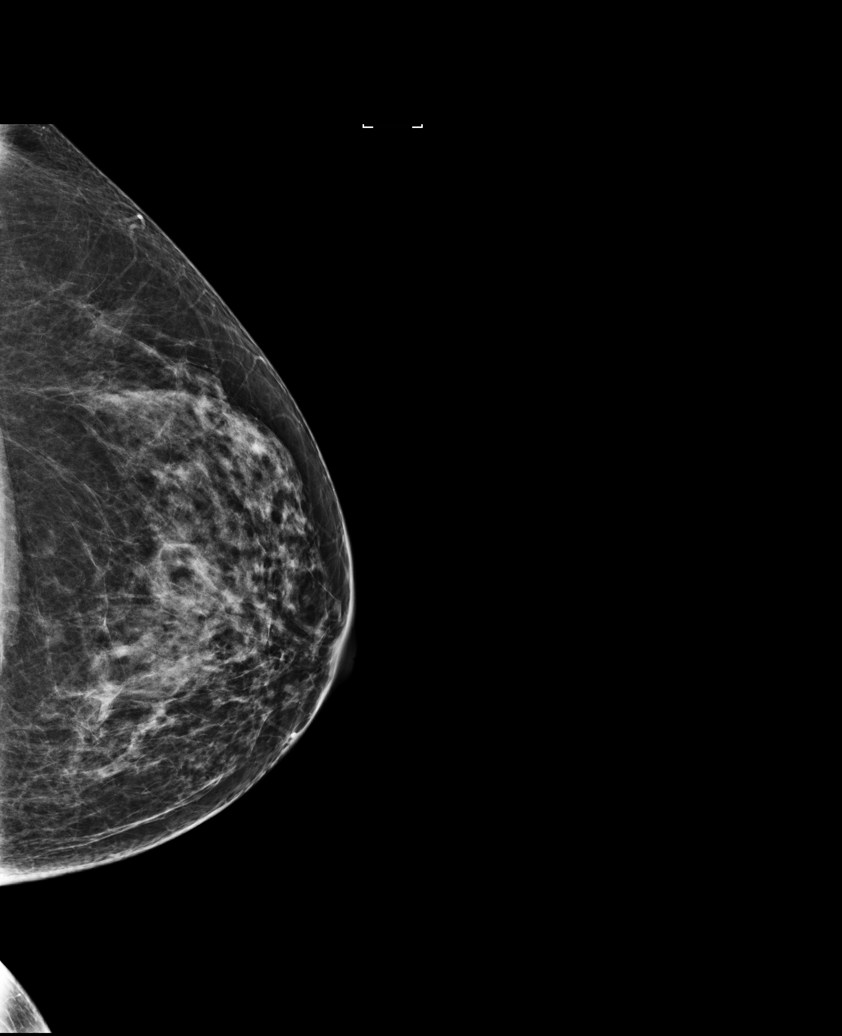

[R MLO (1 of 2)]
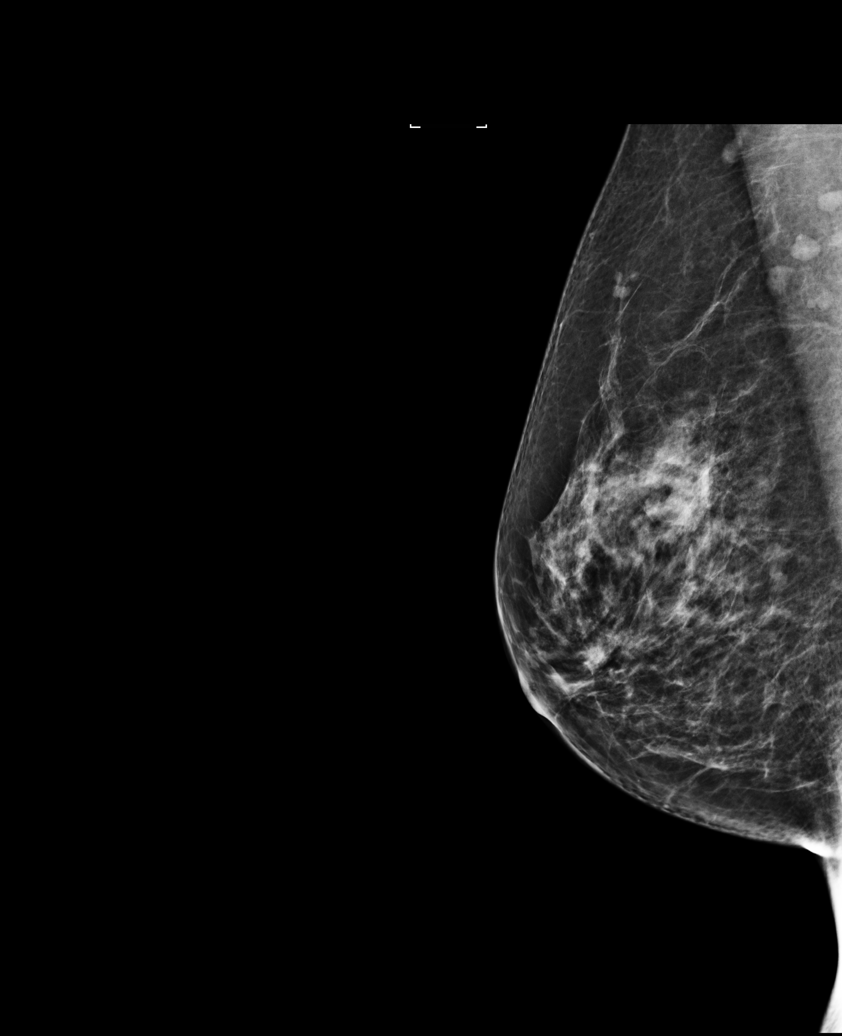

[R MLO (2 of 2)]
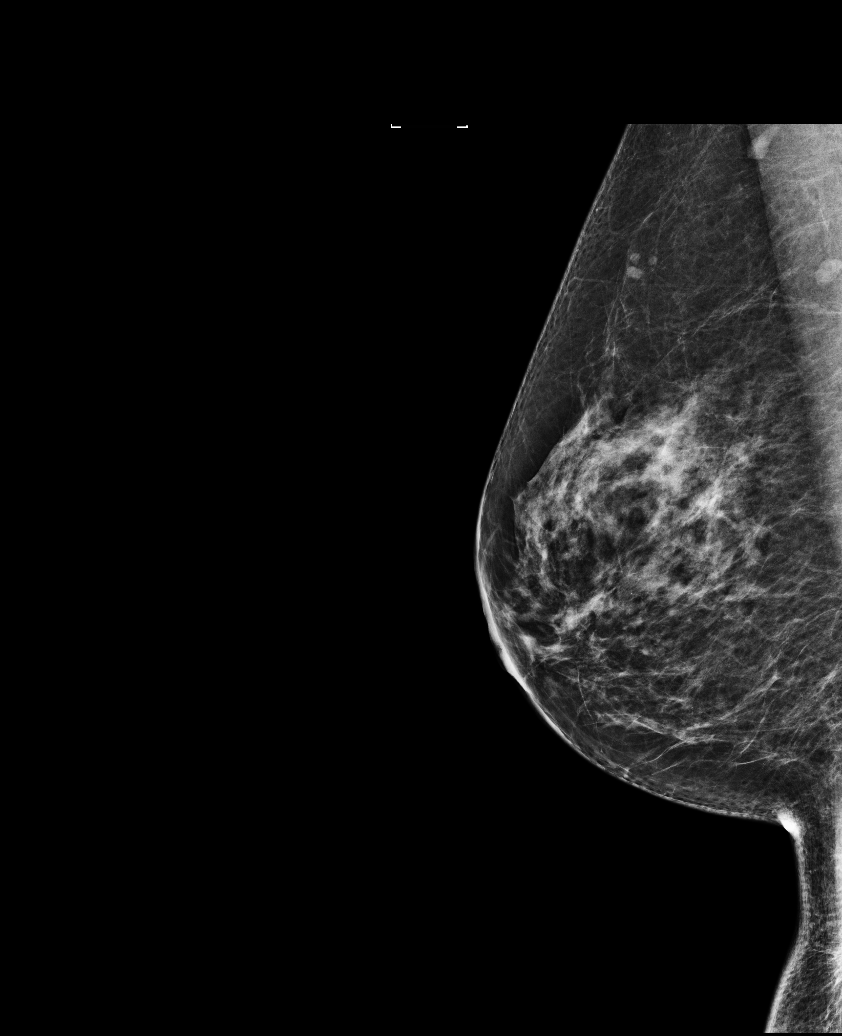

[R CC]
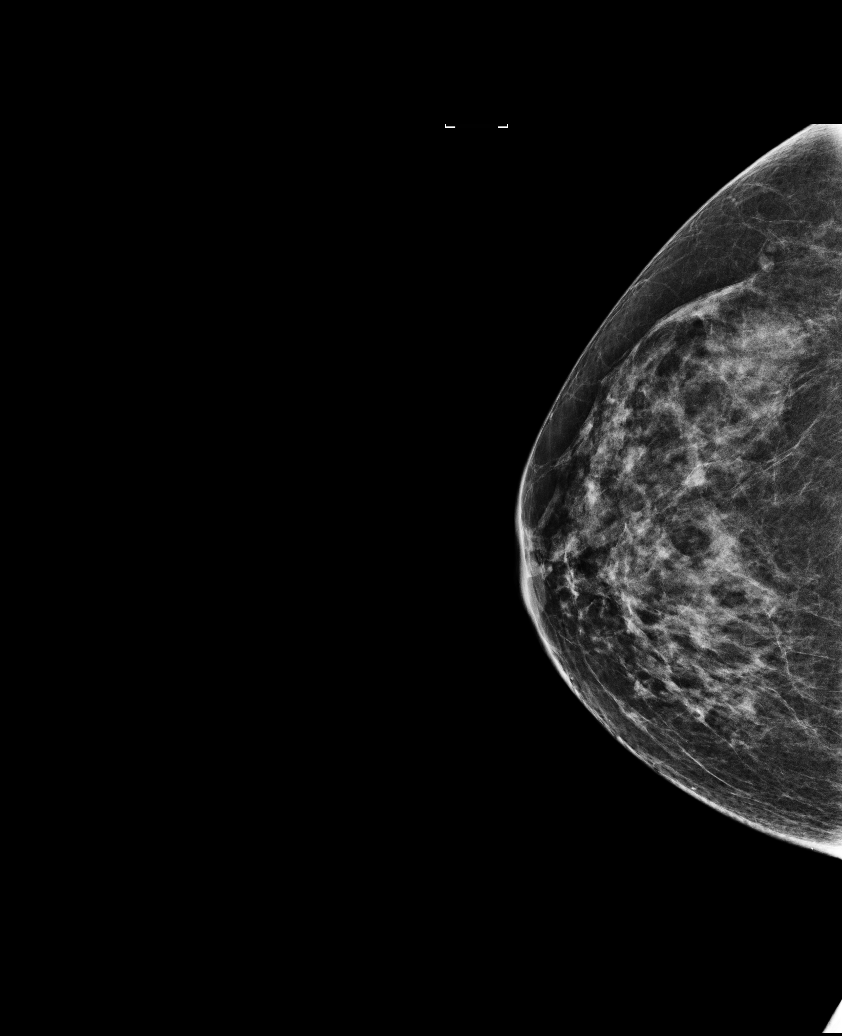

[L MLO]
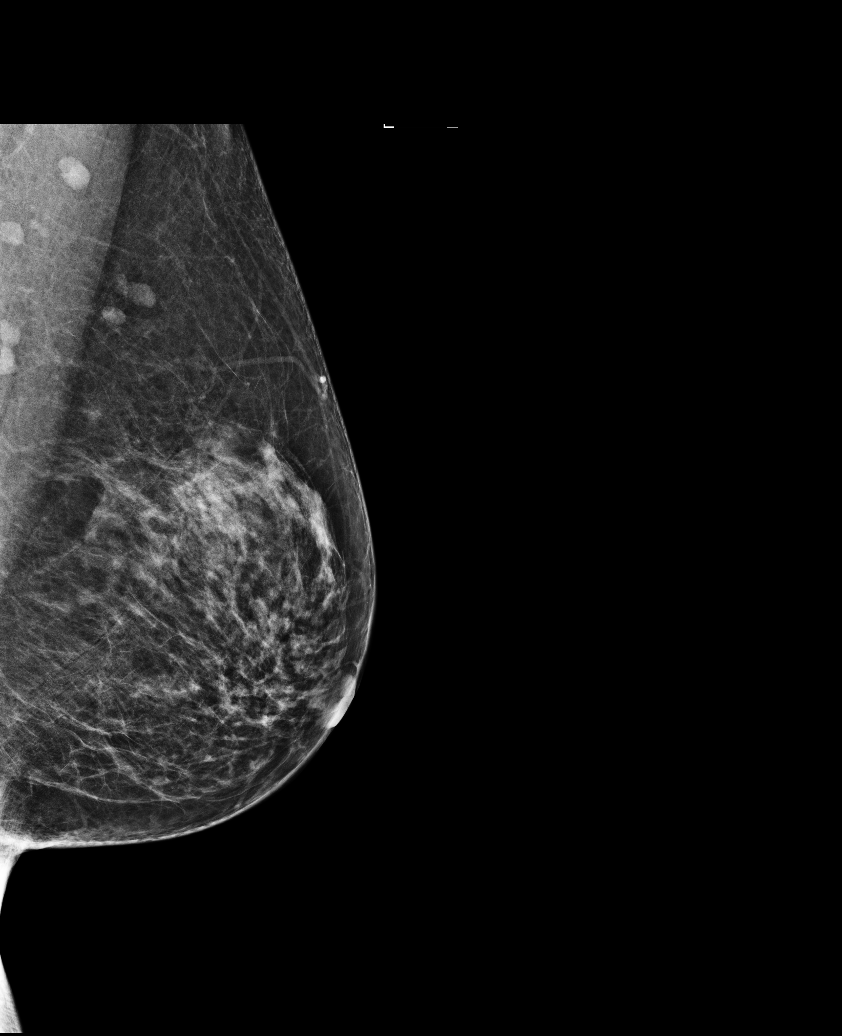

[5 of 5 positions shown; findings below may reference images not displayed]

ACR Breast Density Category c: The breast tissue is heterogeneously
dense, which may obscure small masses.
FINDINGS: There are no findings suspicious for malignancy. Images were
processed with CAD.
IMPRESSION: No mammographic evidence of malignancy. A result letter of this
screening mammogram will be mailed directly to the patient.

RECOMMENDATION:
Screening mammogram in one year. (Code:[0J])

BI-RADS CATEGORY  1: Negative.

## 2017-09-24 ENCOUNTER — Ambulatory Visit: Payer: Commercial Managed Care - PPO | Admitting: Certified Registered Nurse Anesthetist

## 2017-09-24 ENCOUNTER — Ambulatory Visit
Admission: RE | Admit: 2017-09-24 | Discharge: 2017-09-24 | Disposition: A | Discharge status: Home / Self Care | Payer: Commercial Managed Care - PPO | Source: Ambulatory Visit | Attending: Gastroenterology | Admitting: Gastroenterology

## 2017-09-24 ENCOUNTER — Encounter: Payer: Self-pay | Admitting: *Deleted

## 2017-09-24 ENCOUNTER — Encounter
Admission: RE | Disposition: A | Discharge status: Home / Self Care | Payer: Self-pay | Source: Ambulatory Visit | Attending: Gastroenterology

## 2017-09-24 DIAGNOSIS — F329 Major depressive disorder, single episode, unspecified: Secondary | ICD-10-CM | POA: Diagnosis not present

## 2017-09-24 DIAGNOSIS — Z79899 Other long term (current) drug therapy: Secondary | ICD-10-CM | POA: Diagnosis not present

## 2017-09-24 DIAGNOSIS — K2270 Barrett's esophagus without dysplasia: Secondary | ICD-10-CM | POA: Insufficient documentation

## 2017-09-24 DIAGNOSIS — E119 Type 2 diabetes mellitus without complications: Secondary | ICD-10-CM | POA: Insufficient documentation

## 2017-09-24 DIAGNOSIS — F17200 Nicotine dependence, unspecified, uncomplicated: Secondary | ICD-10-CM | POA: Diagnosis not present

## 2017-09-24 DIAGNOSIS — K219 Gastro-esophageal reflux disease without esophagitis: Secondary | ICD-10-CM | POA: Diagnosis not present

## 2017-09-24 DIAGNOSIS — K625 Hemorrhage of anus and rectum: Secondary | ICD-10-CM | POA: Diagnosis not present

## 2017-09-24 DIAGNOSIS — K5730 Diverticulosis of large intestine without perforation or abscess without bleeding: Secondary | ICD-10-CM | POA: Diagnosis not present

## 2017-09-24 DIAGNOSIS — Z7984 Long term (current) use of oral hypoglycemic drugs: Secondary | ICD-10-CM | POA: Diagnosis not present

## 2017-09-24 DIAGNOSIS — D125 Benign neoplasm of sigmoid colon: Secondary | ICD-10-CM | POA: Diagnosis not present

## 2017-09-24 DIAGNOSIS — K2950 Unspecified chronic gastritis without bleeding: Secondary | ICD-10-CM | POA: Insufficient documentation

## 2017-09-24 HISTORY — DX: Type 2 diabetes mellitus without complications: E11.9

## 2017-09-24 HISTORY — PX: COLONOSCOPY WITH PROPOFOL: SHX5780

## 2017-09-24 HISTORY — PX: ESOPHAGOGASTRODUODENOSCOPY (EGD) WITH PROPOFOL: SHX5813

## 2017-09-24 LAB — GLUCOSE, CAPILLARY: Glucose-Capillary: 86 mg/dL (ref 65–99)

## 2017-09-24 SURGERY — ESOPHAGOGASTRODUODENOSCOPY (EGD) WITH PROPOFOL
Anesthesia: General

## 2017-09-24 MED ORDER — PROPOFOL 500 MG/50ML IV EMUL
INTRAVENOUS | Status: AC
  Filled 2017-09-24: qty 50

## 2017-09-24 MED ORDER — PROPOFOL 10 MG/ML IV BOLUS
INTRAVENOUS | Status: DC | PRN
  Administered 2017-09-24: 20 mg via INTRAVENOUS
  Administered 2017-09-24: 50 mg via INTRAVENOUS
  Administered 2017-09-24: 20 mg via INTRAVENOUS
  Administered 2017-09-24 (×2): 30 mg via INTRAVENOUS

## 2017-09-24 MED ORDER — GLYCOPYRROLATE 0.2 MG/ML IJ SOLN
INTRAMUSCULAR | Status: AC
  Filled 2017-09-24: qty 2

## 2017-09-24 MED ORDER — LIDOCAINE HCL (CARDIAC) 20 MG/ML IV SOLN
INTRAVENOUS | Status: DC | PRN
  Administered 2017-09-24: 20 mg via INTRAVENOUS
  Administered 2017-09-24: 60 mg via INTRAVENOUS

## 2017-09-24 MED ORDER — SODIUM CHLORIDE 0.9 % IV SOLN: INTRAVENOUS | Status: DC

## 2017-09-24 MED ORDER — PHENYLEPHRINE HCL 10 MG/ML IJ SOLN
INTRAMUSCULAR | Status: DC | PRN
  Administered 2017-09-24 (×3): 100 ug via INTRAVENOUS

## 2017-09-24 MED ORDER — EPHEDRINE SULFATE 50 MG/ML IJ SOLN
INTRAMUSCULAR | Status: AC
  Filled 2017-09-24: qty 1

## 2017-09-24 MED ORDER — GLYCOPYRROLATE 0.2 MG/ML IJ SOLN
INTRAMUSCULAR | Status: DC | PRN
Start: 2017-09-24 — End: 2017-09-24
  Administered 2017-09-24: 0.2 mg via INTRAVENOUS

## 2017-09-24 MED ORDER — LIDOCAINE HCL (PF) 2 % IJ SOLN
INTRAMUSCULAR | Status: AC
  Filled 2017-09-24: qty 10

## 2017-09-24 MED ORDER — SUCCINYLCHOLINE CHLORIDE 20 MG/ML IJ SOLN
INTRAMUSCULAR | Status: AC
  Filled 2017-09-24: qty 1

## 2017-09-24 MED ORDER — PROPOFOL 500 MG/50ML IV EMUL
INTRAVENOUS | Status: DC | PRN
  Administered 2017-09-24: 150 ug/kg/min via INTRAVENOUS

## 2017-09-24 MED ORDER — SODIUM CHLORIDE 0.9 % IV SOLN
INTRAVENOUS | Status: DC
  Administered 2017-09-24: 1000 mL via INTRAVENOUS

## 2017-09-24 MED ORDER — PHENYLEPHRINE HCL 10 MG/ML IJ SOLN
INTRAMUSCULAR | Status: AC
  Filled 2017-09-24: qty 1

## 2017-09-24 MED ORDER — PROPOFOL 10 MG/ML IV BOLUS
INTRAVENOUS | Status: AC
  Filled 2017-09-24: qty 20

## 2017-09-24 NOTE — Anesthesia Procedure Notes (Signed)
Date/Time: 09/24/2017 7:39 AM Performed by: Darlyne Russian Pre-anesthesia Checklist: Patient identified, Emergency Drugs available, Suction available, Patient being monitored and Timeout performed Oxygen Delivery Method: Nasal cannula Placement Confirmation: positive ETCO2

## 2017-09-24 NOTE — Op Note (Signed)
[facility] Gastroenterology Patient Name: Melanie Morales Procedure Date: 09/24/2017 7:39 AM MRN: [id number] Account #: [id number] Date of Birth: 11-01-1961 Admit Type: Outpatient Age: 56 Room: [facility] ENDO ROOM 4 Gender: Female Note Status: Finalized Procedure:            Colonoscopy Indications:          Follow-up of diverticulitis Providers:            Lollie Sails, MD Referring MD:         Ane Payment, MD (Referring MD) Medicines:            Monitored Anesthesia Care Complications:        No immediate complications. Procedure:            Pre-Anesthesia Assessment:                       - ASA Grade Assessment: III - A patient with severe                        systemic disease.                       After obtaining informed consent, the colonoscope was                        passed under direct vision. Throughout the procedure,                        the patient's blood pressure, pulse, and oxygen                        saturations were monitored continuously. The                        Colonoscope was introduced through the anus and                        advanced to the the cecum, identified by appendiceal                        orifice and ileocecal valve. The colonoscopy was                        unusually difficult due to significant looping and a                        tortuous colon. [Solution]. The quality of the bowel                        preparation was good. Findings:      A 6 mm polyp was found in the proximal sigmoid colon. The polyp was       sessile. The polyp was removed with a cold snare. Resection and       retrieval were complete.      Three sessile polyps were found in the mid sigmoid colon. The polyps       were 2 to 4 mm in size. These polyps were removed with a cold snare.       Resection and retrieval were complete.      Scattered small and large-mouthed diverticula were found in the sigmoid  colon, descending colon,  splenic flexure and transverse colon.      The digital rectal exam was normal.      The retroflexed view of the distal rectum and anal verge was normal and       showed no anal or rectal abnormalities. Impression:           - One 6 mm polyp in the proximal sigmoid colon, removed                        with a cold snare. Resected and retrieved.                       - Three 2 to 4 mm polyps in the mid sigmoid colon,                        removed with a cold snare. Resected and retrieved.                       - Diverticulosis in the sigmoid colon, in the                        descending colon, at the splenic flexure and in the                        transverse colon.                       - The distal rectum and anal verge are normal on                        retroflexion view. Recommendation:       - Discharge patient to home. Procedure Code(s):    --- Professional ---                       [phone], Colonoscopy, flexible; with removal of tumor(s),                        polyp(s), or other lesion(s) by snare technique Diagnosis Code(s):    --- Professional ---                       D12.5, Benign neoplasm of sigmoid colon                       K57.32, Diverticulitis of large intestine without                        perforation or abscess without bleeding                       K57.30, Diverticulosis of large intestine without                        perforation or abscess without bleeding CPT copyright 2016 American Medical Association. All rights reserved. The codes documented in this report are preliminary and upon coder review may  be revised to meet current compliance requirements. Lollie Sails, MD 09/24/2017 8:56:37 AM This report has been signed electronically. Number of Addenda: 0 Note Initiated On: 09/24/2017 7:39 AM Scope Withdrawal Time: 0 hours 19 minutes 25 seconds  Total Procedure Duration: 0 hours 37 minutes 7 seconds       [facility]

## 2017-09-24 NOTE — Op Note (Signed)
[facility] Gastroenterology Patient Name: Melanie Morales Procedure Date: 09/24/2017 7:42 AM MRN: [id number] Account #: [id number] Date of Birth: Jul 10, 1961 Admit Type: Outpatient Age: 56 Room: [facility] ENDO ROOM 4 Gender: Female Note Status: Finalized Procedure:            Upper GI endoscopy Indications:          Follow-up of Barrett's esophagus Providers:            Lollie Sails, MD Referring MD:         Ane Payment, MD (Referring MD) Medicines:            Monitored Anesthesia Care Complications:        No immediate complications. Procedure:            Pre-Anesthesia Assessment:                       - ASA Grade Assessment: III - A patient with severe                        systemic disease.                       After obtaining informed consent, the endoscope was                        passed under direct vision. Throughout the procedure,                        the patient's blood pressure, pulse, and oxygen                        saturations were monitored continuously. The Endoscope                        was introduced through the mouth, and advanced to the                        third part of duodenum. The upper GI endoscopy was                        accomplished without difficulty. The patient tolerated                        the procedure well. The patient tolerated the procedure                        well. Findings:      There were esophageal mucosal changes secondary to established       short-segment Barrett's disease present at the gastroesophageal       junction. The maximum longitudinal extent of these mucosal changes was 1       cm in length. Mucosa was biopsied with a cold forceps for histology in 4       quadrants at the gastroesophageal junction. One specimen bottle was sent       to pathology.      The exam of the esophagus was otherwise normal.      Patchy mild inflammation characterized by congestion (edema) and       erythema  was found in the gastric body, on the greater curvature of the  stomach and in the gastric antrum. Biopsies were taken with a cold       forceps for histology. Biopsies were taken with a cold forceps for       Helicobacter pylori testing.      The examined duodenum was normal. Impression:           - Esophageal mucosal changes secondary to established                        short-segment Barrett's disease. Biopsied.                       - Gastritis. Biopsied.                       - Normal examined duodenum.                       - of note, biopsies fro the body of the stomach were                        inadvertantly placed into the distal esophageal jar. I                        discussed this with pathologist, and will not be a                        problem. Recommendation:       - Perform a colonoscopy today.                       - Continue present medications. Procedure Code(s):    --- Professional ---                       [phone], Esophagogastroduodenoscopy, flexible, transoral;                        with biopsy, single or multiple Diagnosis Code(s):    --- Professional ---                       K22.70, Barrett's esophagus without dysplasia                       K29.70, Gastritis, unspecified, without bleeding CPT copyright 2016 American Medical Association. All rights reserved. The codes documented in this report are preliminary and upon coder review may  be revised to meet current compliance requirements. Lollie Sails, MD 09/24/2017 8:11:17 AM This report has been signed electronically. Number of Addenda: 0 Note Initiated On: 09/24/2017 7:42 AM      [facility]

## 2017-09-24 NOTE — Transfer of Care (Signed)
Immediate Anesthesia Transfer of Care Note  Patient: Melanie Morales  Procedure(s) Performed: ESOPHAGOGASTRODUODENOSCOPY (EGD) WITH PROPOFOL (N/A ) COLONOSCOPY WITH PROPOFOL (N/A )  Patient Location: PACU  Anesthesia Type:General  Level of Consciousness: awake, alert  and patient cooperative  Airway & Oxygen Therapy: Patient Spontanous Breathing and Patient connected to nasal cannula oxygen  Post-op Assessment: Report given to RN and Post -op Vital signs reviewed and stable  Post vital signs: Reviewed and stable  Last Vitals:  Vitals:   09/24/17 0706 09/24/17 0858  BP: (!) 128/50 (!) 101/48  Pulse: 81 71  Resp: 18 20  Temp: 36.8 C (!) 36.1 C  SpO2: 100% 99%    Last Pain:  Vitals:   09/24/17 0858  TempSrc: Tympanic         Complications: No apparent anesthesia complications

## 2017-09-24 NOTE — H&P (Signed)
Outpatient short stay form Pre-procedure 09/24/2017 7:41 AM Lollie Sails MD  Primary Physician: Dr Sherilyn Cooter  Reason for visit:  EGD and colonoscopy  History of present illness:  Patient is a 56 year old female presenting today as above. She has personal history of Barrett's esophagus. She does take a daily PPI and has no dysphagia or reflux symptoms. He had an episode several months ago was rectal bleeding for several days and was treated for diverticulitis. She is presenting today for further evaluation. She takes no aspirin or blood thinning agents.    Current Facility-Administered Medications:  .  0.9 %  sodium chloride infusion, , Intravenous, Continuous, Lollie Sails, MD, Last Rate: 20 mL/hr at 09/24/17 0722, 1,000 mL at 09/24/17 0722 .  0.9 %  sodium chloride infusion, , Intravenous, Continuous, Lollie Sails, MD  Facility-Administered Medications Ordered in Other Encounters:  .  glycopyrrolate (ROBINUL) injection, , , Anesthesia Intra-op, Darlyne Russian, CRNA, 0.2 mg at 09/24/17 0814  Prescriptions Prior to Admission  Medication Sig Dispense Refill Last Dose  . amitriptyline (ELAVIL) 100 MG tablet Take 100 mg by mouth at bedtime.   07/25/2015 at Unknown time  . FLUoxetine (PROZAC) 20 MG capsule Take 20 mg by mouth daily.   07/25/2015 at Unknown time  . fluticasone (FLONASE) 50 MCG/ACT nasal spray Place 1 spray into both nostrils daily.   07/25/2015 at Unknown time  . lovastatin (MEVACOR) 40 MG tablet Take 40 mg by mouth at bedtime.   07/25/2015 at Unknown time  . metFORMIN (GLUCOPHAGE-XR) 500 MG 24 hr tablet Take 500 mg by mouth daily with breakfast.   07/25/2015 at Unknown time  . omeprazole (PRILOSEC) 20 MG capsule Take 20 mg by mouth daily.   07/25/2015 at Unknown time  . pravastatin (PRAVACHOL) 20 MG tablet Take 20 mg by mouth daily.   Not Taking at Unknown time  . [DISCONTINUED] acetaminophen-codeine (TYLENOL #3) 300-30 MG per tablet Take by mouth every 4 (four) hours as  needed for moderate pain.   Not Taking at Unknown time     No Known Allergies   Past Medical History:  Diagnosis Date  . Depression   . Diabetes mellitus without complication ([place])   . 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 GERD (gastroesophageal reflux disease)     Review of systems:      Physical Exam    Heart and lungs: Regular rate and rhythm without rub or gallop, lungs are bilaterally clear    HEENT: Normocephalic atraumatic eyes are anicteric    Other:     Pertinant exam for procedure: Soft nontender nondistended bowel sounds positive normoactive    Planned proceedures: EGD, colonoscopy and indicated procedures. I have discussed the risks benefits and complications of procedures to include not limited to bleeding, infection, perforation and the risk of sedation and the patient wishes to proceed.    Lollie Sails, MD Gastroenterology 09/24/2017  7:41 AM

## 2017-09-24 NOTE — Anesthesia Postprocedure Evaluation (Signed)
Anesthesia Post Note  Patient: Melanie Morales  Procedure(s) Performed: ESOPHAGOGASTRODUODENOSCOPY (EGD) WITH PROPOFOL (N/A ) COLONOSCOPY WITH PROPOFOL (N/A )  Patient location during evaluation: Endoscopy Anesthesia Type: General Level of consciousness: awake and alert Pain management: pain level controlled Vital Signs Assessment: post-procedure vital signs reviewed and stable Respiratory status: spontaneous breathing and respiratory function stable Cardiovascular status: stable Anesthetic complications: no     Last Vitals:  Vitals:   09/24/17 0706 09/24/17 0858  BP: (!) 128/50 (!) 101/48  Pulse: 81 71  Resp: 18 20  Temp: 36.8 C (!) 36.1 C  SpO2: 100% 99%    Last Pain:  Vitals:   09/24/17 0858  TempSrc: Tympanic                 KEPHART,WILLIAM K

## 2017-09-24 NOTE — Anesthesia Preprocedure Evaluation (Signed)
Anesthesia Evaluation  Patient identified by MRN, date of birth, ID band Patient awake    Reviewed: Allergy & Precautions, NPO status , Patient's Chart, lab work & pertinent test results  History of Anesthesia Complications Negative for: history of anesthetic complications  Airway Mallampati: II       Dental   Pulmonary neg sleep apnea, neg COPD, Current Smoker,           Cardiovascular (-) hypertension(-) Past MI and (-) CHF (-) dysrhythmias (-) Valvular Problems/Murmurs     Neuro/Psych neg Seizures Depression    GI/Hepatic Neg liver ROS, GERD  Medicated and Controlled,  Endo/Other  diabetes, Type 2, Oral Hypoglycemic Agents  Renal/GU negative Renal ROS     Musculoskeletal   Abdominal   Peds  Hematology   Anesthesia Other Findings   Reproductive/Obstetrics                             Anesthesia Physical Anesthesia Plan  ASA: III  Anesthesia Plan: General   Post-op Pain Management:    Induction: Intravenous  PONV Risk Score and Plan: 2 and Ondansetron, Dexamethasone and Propofol infusion  Airway Management Planned: Nasal Cannula  Additional Equipment:   Intra-op Plan:   Post-operative Plan:   Informed Consent: I have reviewed the patients History and Physical, chart, labs and discussed the procedure including the risks, benefits and alternatives for the proposed anesthesia with the patient or authorized representative who has indicated his/her understanding and acceptance.     Plan Discussed with:   Anesthesia Plan Comments:         Anesthesia Quick Evaluation

## 2017-09-24 NOTE — Anesthesia Post-op Follow-up Note (Signed)
Anesthesia QCDR form completed.        

## 2017-09-27 ENCOUNTER — Encounter: Payer: Self-pay | Admitting: Gastroenterology

## 2017-09-27 LAB — SURGICAL PATHOLOGY

## 2018-08-31 ENCOUNTER — Other Ambulatory Visit: Payer: Self-pay | Admitting: Pediatrics

## 2018-09-16 DIAGNOSIS — J302 Other seasonal allergic rhinitis: Secondary | ICD-10-CM | POA: Insufficient documentation

## 2018-09-19 ENCOUNTER — Other Ambulatory Visit: Payer: Self-pay | Admitting: Pediatrics

## 2018-09-19 DIAGNOSIS — Z1231 Encounter for screening mammogram for malignant neoplasm of breast: Secondary | ICD-10-CM

## 2018-10-03 ENCOUNTER — Ambulatory Visit
Admission: RE | Admit: 2018-10-03 | Discharge: 2018-10-03 | Disposition: A | Discharge status: Home / Self Care | Payer: Commercial Managed Care - PPO | Source: Ambulatory Visit | Attending: Pediatrics | Admitting: Pediatrics

## 2018-10-03 DIAGNOSIS — Z1231 Encounter for screening mammogram for malignant neoplasm of breast: Secondary | ICD-10-CM

## 2018-10-03 IMAGING — MG MM DIGITAL SCREENING BILAT W/ TOMO W/ CAD
8 series · 8 of 24 positions shown · non-contrast
Comparison: Previous exam(s).

CLINICAL DATA: Screening.

EXAM:
DIGITAL SCREENING BILATERAL MAMMOGRAM WITH TOMO AND CAD

[L MLO synth-2D]
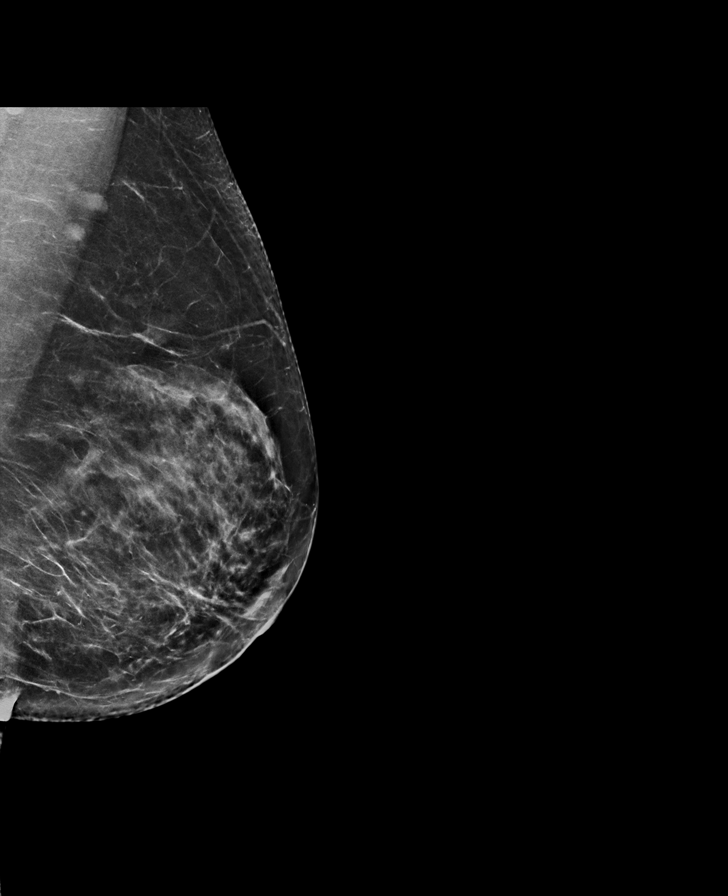

[R CC synth-2D]
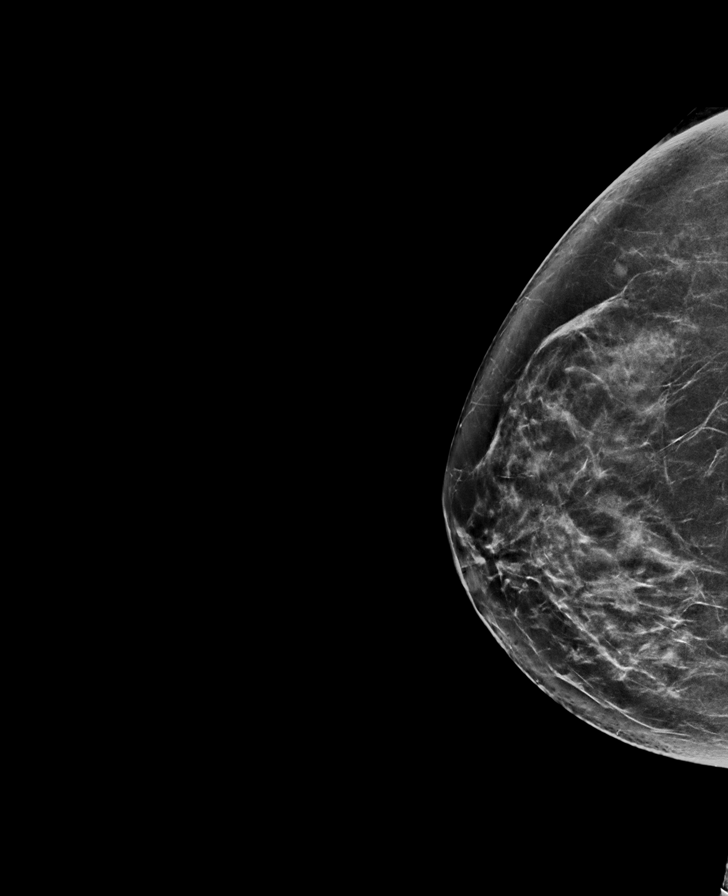

[R MLO synth-2D]
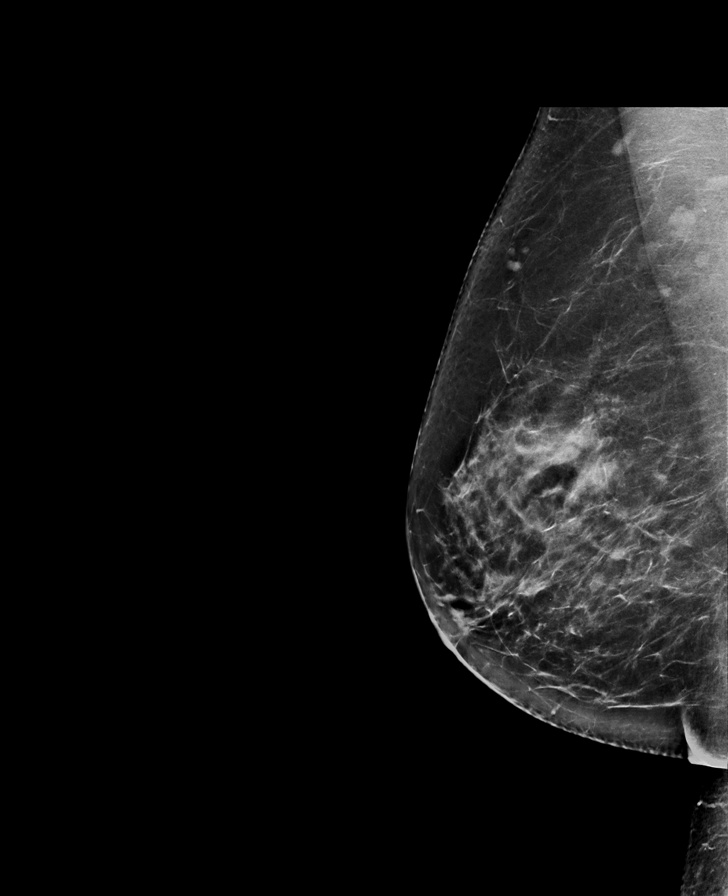

[L CC synth-2D]
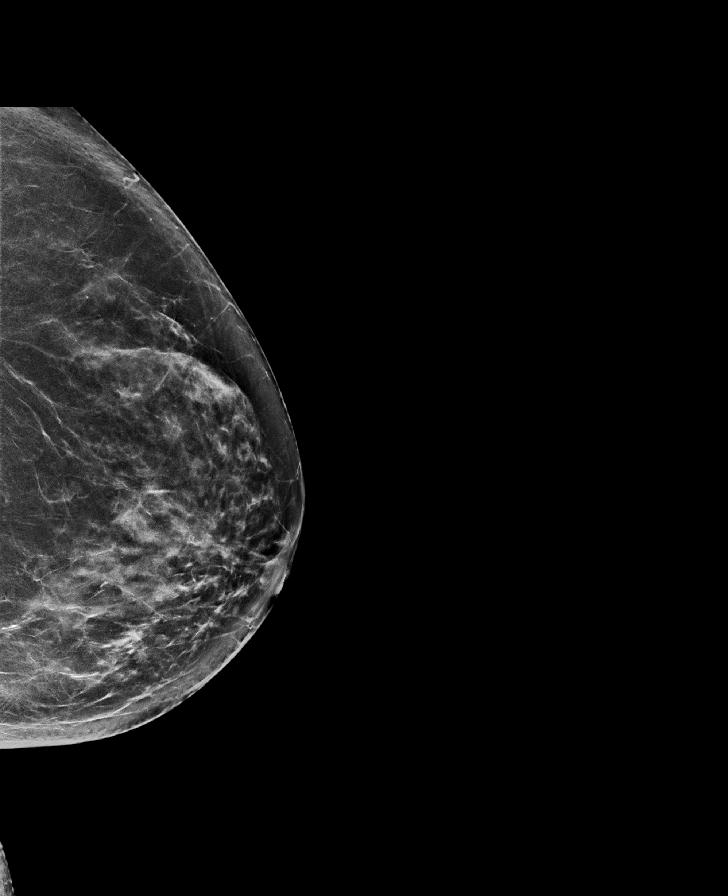

[L MLO tomo · tomo slice 40/79.0]
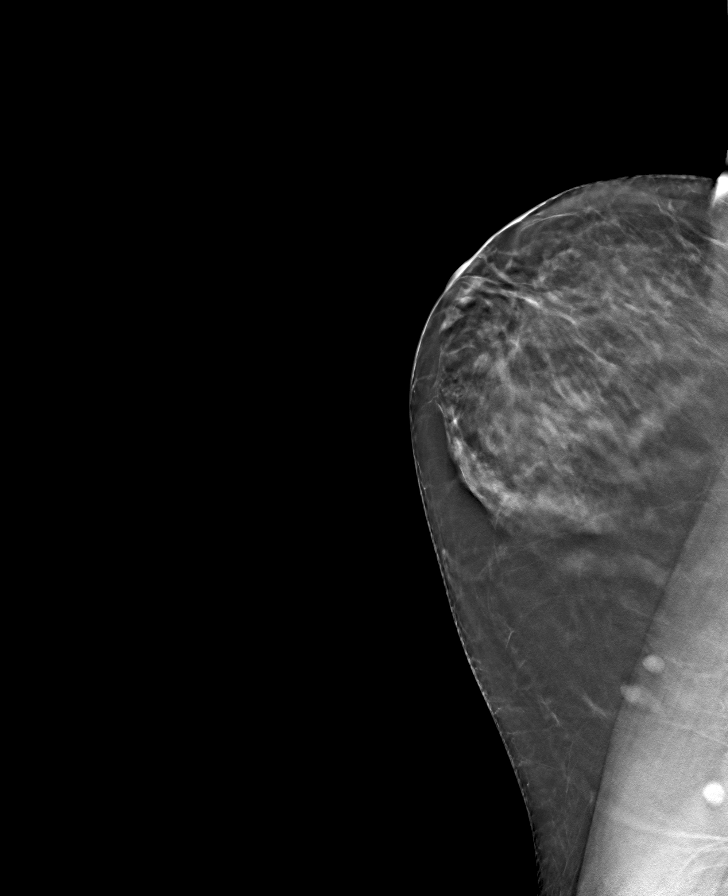

[R MLO tomo · tomo slice 41/82.0]
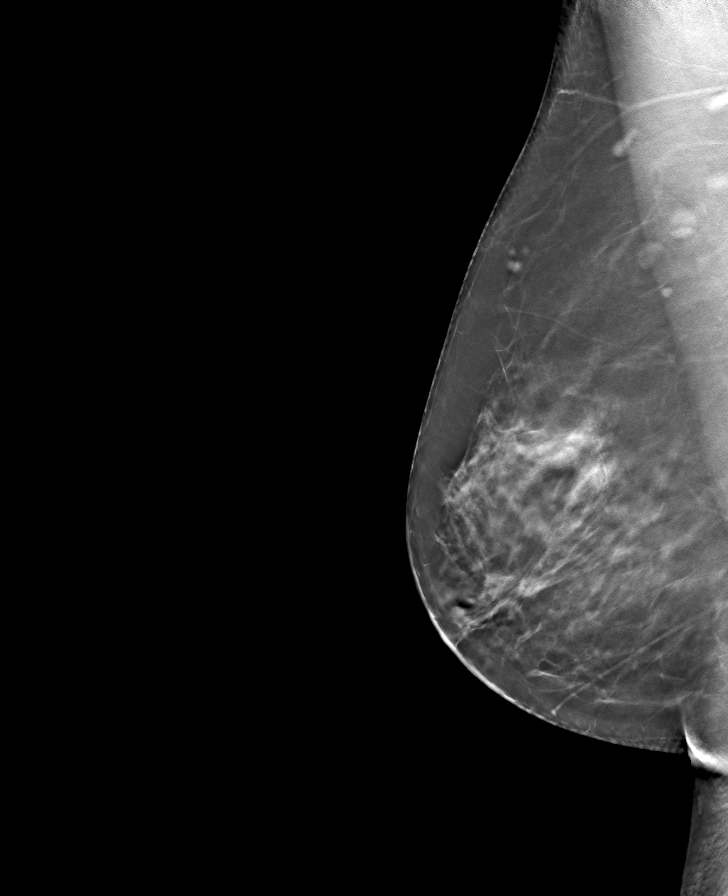

[R CC tomo · tomo slice 37/72.0]
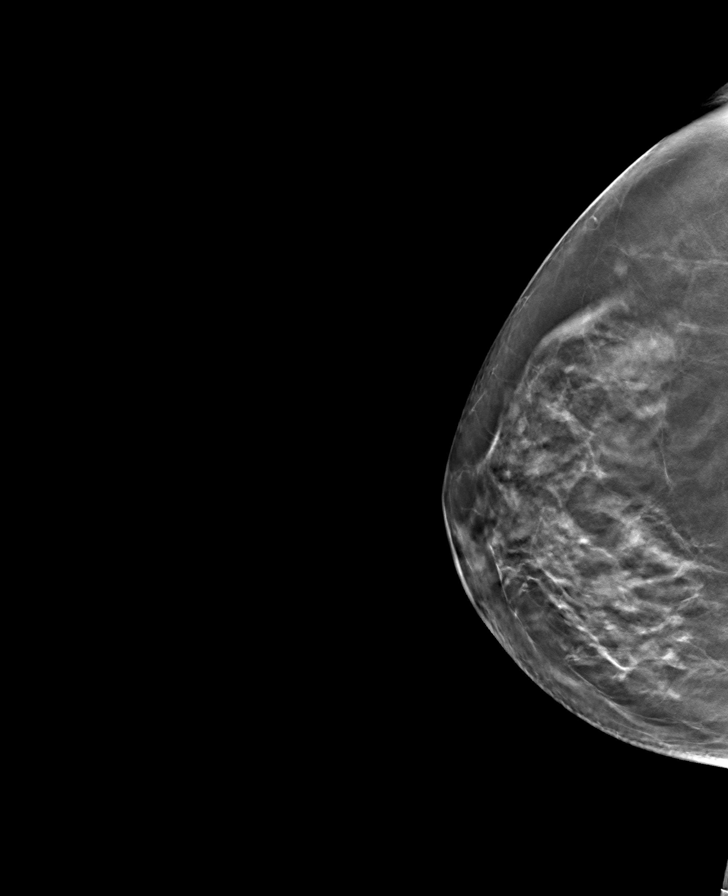

[L CC tomo · tomo slice 37/74.0]
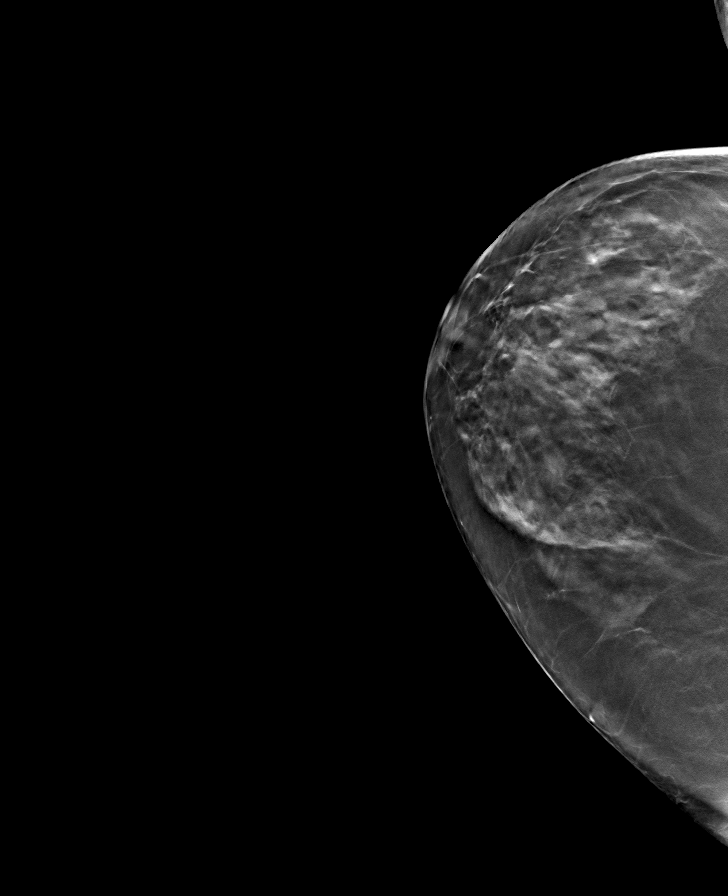

[8 of 24 positions shown; findings below may reference images not displayed]

ACR Breast Density Category c: The breast tissue is heterogeneously
dense, which may obscure small masses.
FINDINGS: There are no findings suspicious for malignancy. Images were
processed with CAD.
IMPRESSION: No mammographic evidence of malignancy. A result letter of this
screening mammogram will be mailed directly to the patient.

RECOMMENDATION:
Screening mammogram in one year. (Code:[5V])

BI-RADS CATEGORY  1: Negative.

## 2019-01-07 DIAGNOSIS — Z87891 Personal history of nicotine dependence: Secondary | ICD-10-CM | POA: Insufficient documentation

## 2020-01-11 ENCOUNTER — Other Ambulatory Visit: Payer: Self-pay | Admitting: Pediatrics

## 2020-01-11 DIAGNOSIS — Z1231 Encounter for screening mammogram for malignant neoplasm of breast: Secondary | ICD-10-CM

## 2020-02-07 ENCOUNTER — Ambulatory Visit
Admission: RE | Admit: 2020-02-07 | Discharge: 2020-02-07 | Disposition: A | Discharge status: Home / Self Care | Payer: Commercial Managed Care - PPO | Source: Ambulatory Visit | Attending: Pediatrics | Admitting: Pediatrics

## 2020-02-07 DIAGNOSIS — Z1231 Encounter for screening mammogram for malignant neoplasm of breast: Secondary | ICD-10-CM | POA: Insufficient documentation

## 2020-02-07 IMAGING — MG DIGITAL SCREENING BILAT W/ TOMO W/ CAD
8 series · 8 of 24 positions shown · non-contrast
Comparison: Previous exam(s).

CLINICAL DATA: Screening.

EXAM:
DIGITAL SCREENING BILATERAL MAMMOGRAM WITH TOMO AND CAD

[R MLO synth-2D]
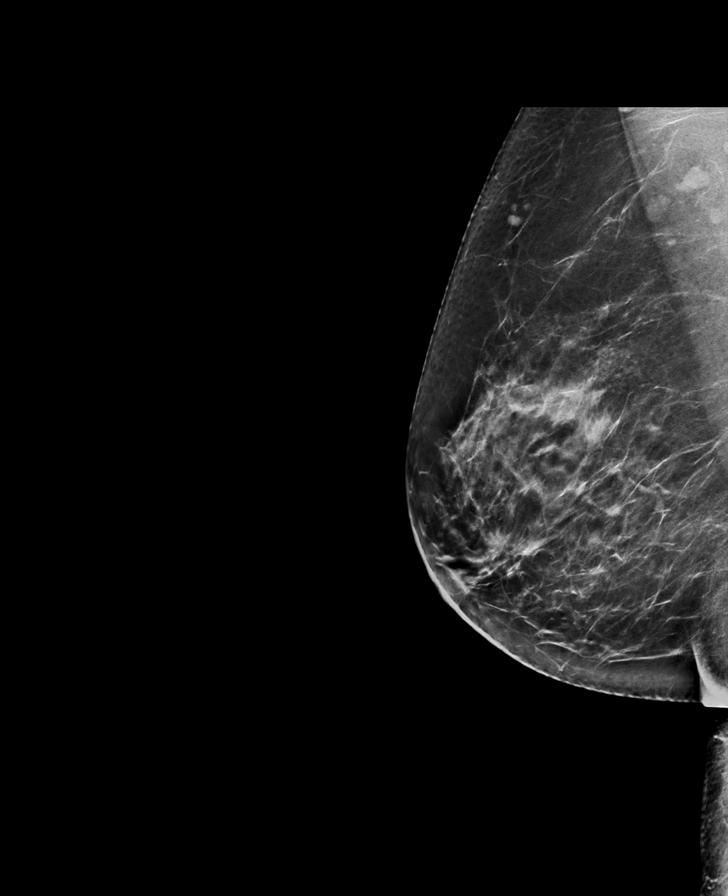

[L MLO synth-2D]
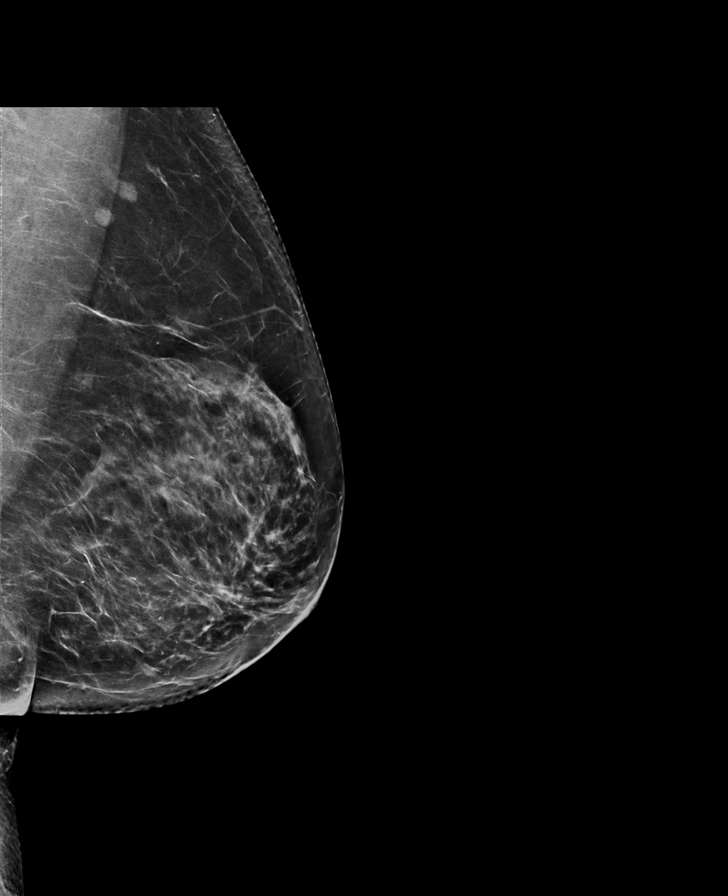

[R CC synth-2D]
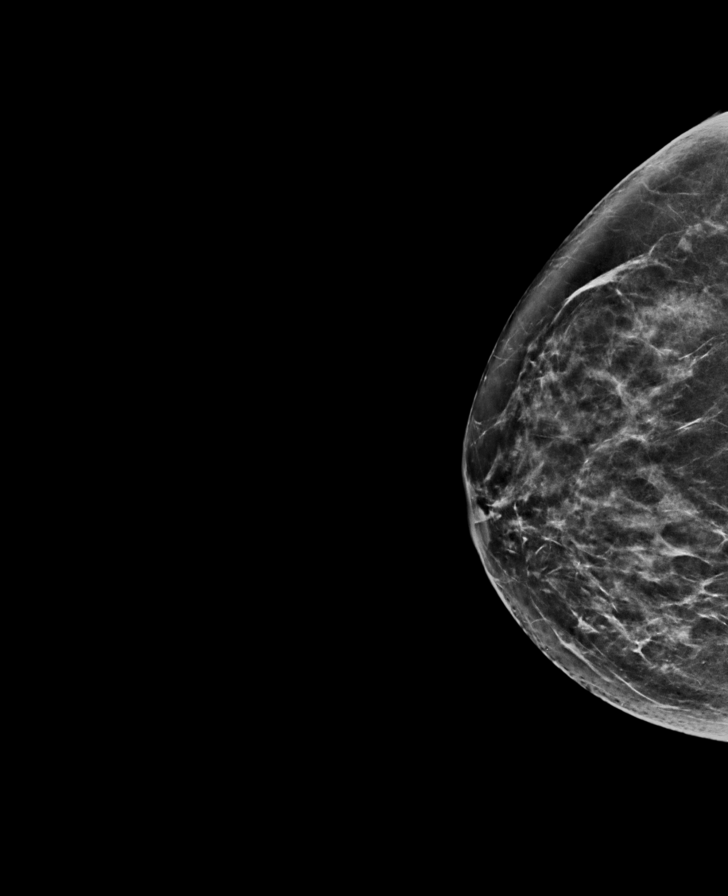

[L CC synth-2D]
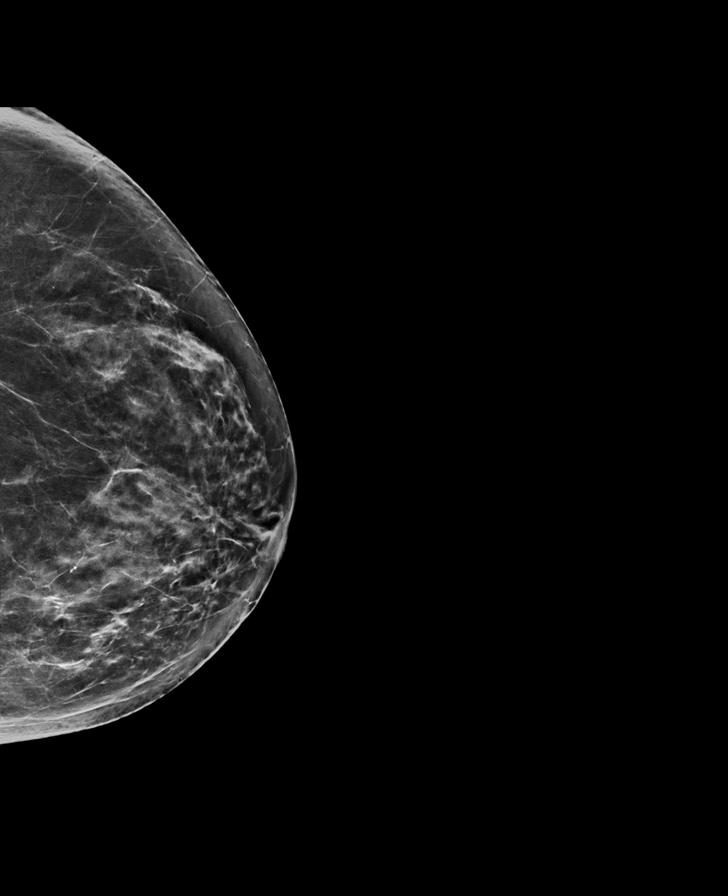

[L CC tomo · tomo slice 35/70.0]
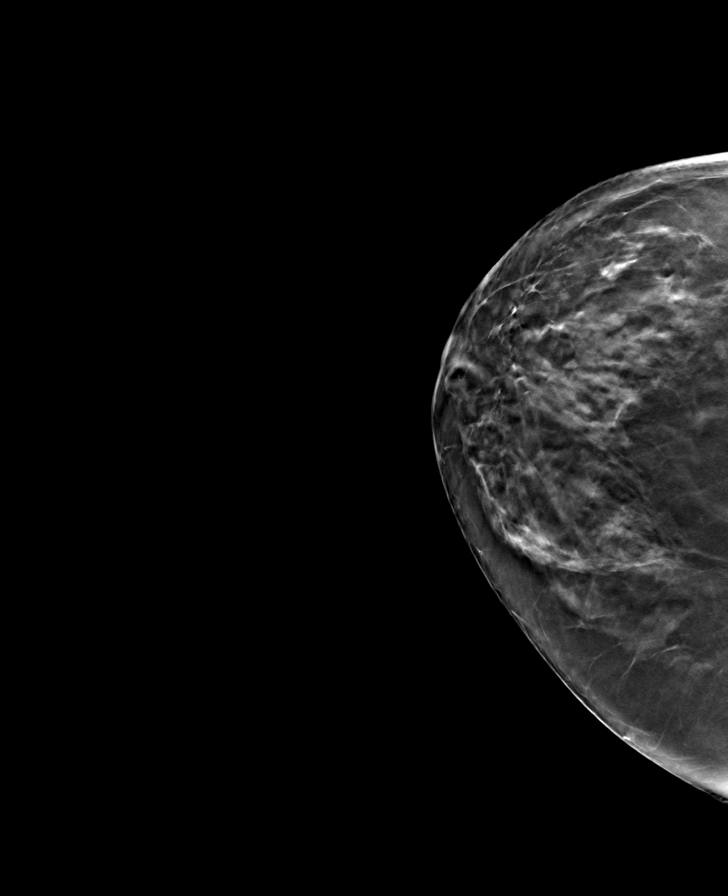

[R MLO tomo · tomo slice 41/80.0]
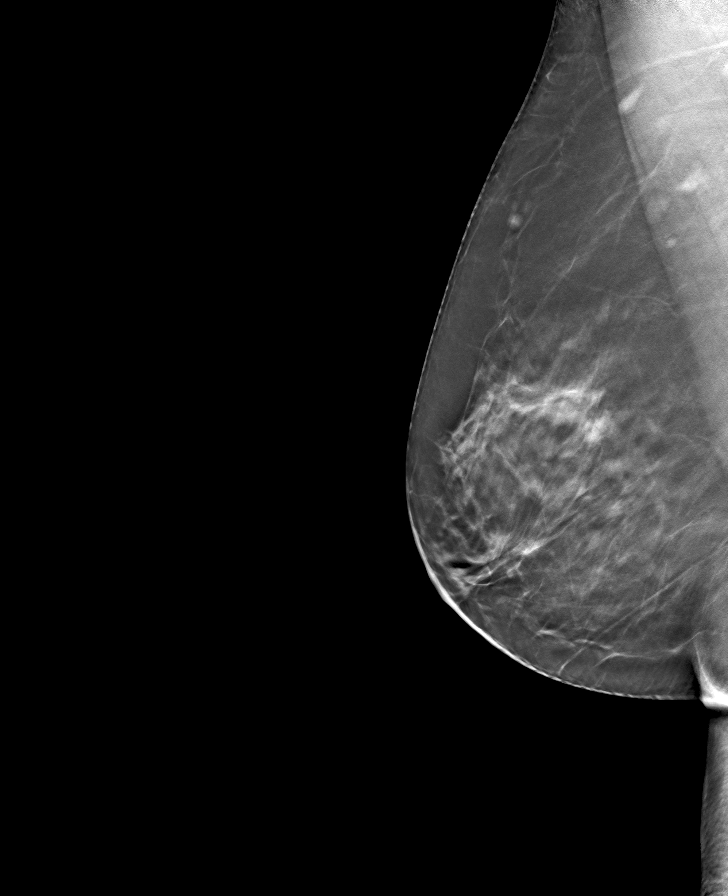

[R CC tomo · tomo slice 33/65.0]
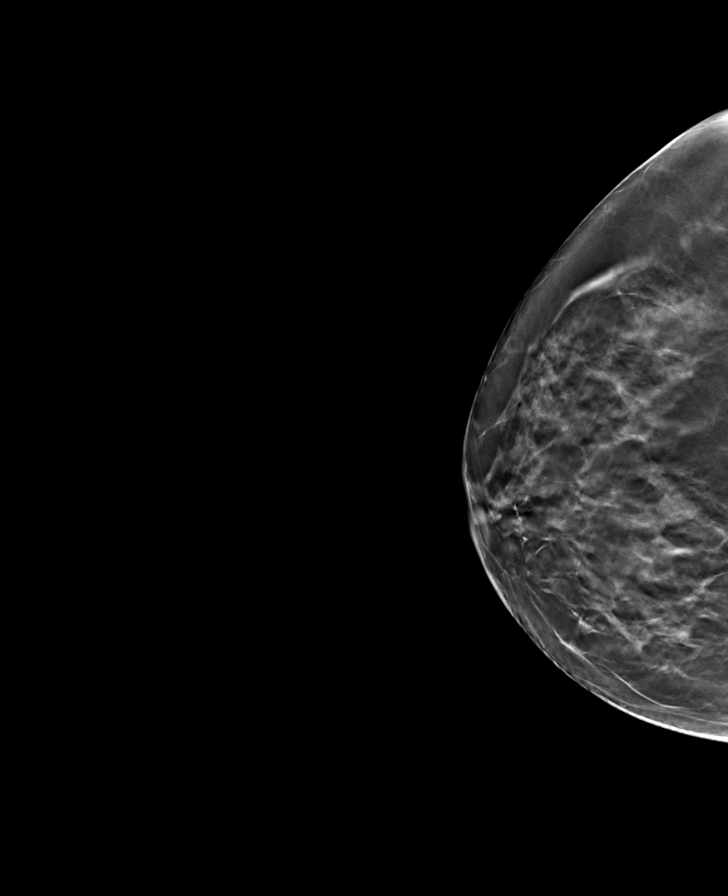

[L MLO tomo · tomo slice 41/82.0]
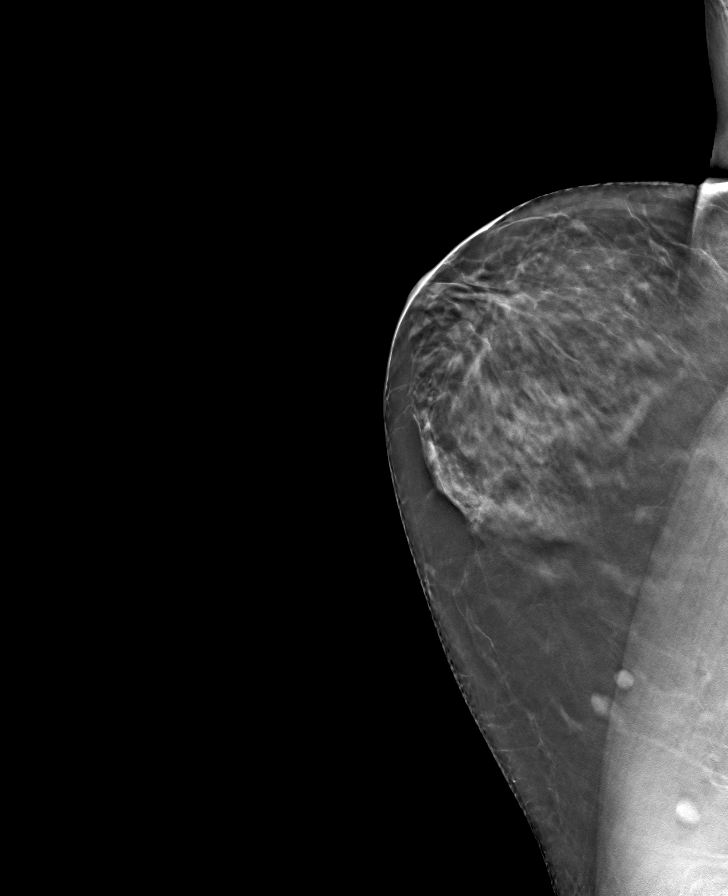

[8 of 24 positions shown; findings below may reference images not displayed]

ACR Breast Density Category c: The breast tissue is heterogeneously
dense, which may obscure small masses.
FINDINGS: There are no findings suspicious for malignancy. Images were
processed with CAD.
IMPRESSION: No mammographic evidence of malignancy. A result letter of this
screening mammogram will be mailed directly to the patient.

RECOMMENDATION:
Screening mammogram in one year. (Code:[5V])

BI-RADS CATEGORY  1: Negative.

## 2020-08-06 ENCOUNTER — Other Ambulatory Visit: Payer: Self-pay | Admitting: Pediatrics

## 2020-08-06 DIAGNOSIS — R1032 Left lower quadrant pain: Secondary | ICD-10-CM

## 2020-08-06 DIAGNOSIS — R140 Abdominal distension (gaseous): Secondary | ICD-10-CM

## 2020-09-03 ENCOUNTER — Other Ambulatory Visit: Payer: Self-pay

## 2020-09-03 ENCOUNTER — Ambulatory Visit
Admission: RE | Admit: 2020-09-03 | Discharge: 2020-09-03 | Disposition: A | Discharge status: Home / Self Care | Payer: Commercial Managed Care - PPO | Source: Ambulatory Visit | Attending: Pediatrics | Admitting: Pediatrics

## 2020-09-03 DIAGNOSIS — R1032 Left lower quadrant pain: Secondary | ICD-10-CM | POA: Diagnosis present

## 2020-09-03 DIAGNOSIS — R140 Abdominal distension (gaseous): Secondary | ICD-10-CM | POA: Diagnosis not present

## 2020-09-03 IMAGING — US US PELVIS COMPLETE WITH TRANSVAGINAL
1 series · 14 of 25 positions shown · non-contrast
Comparison: None

CLINICAL DATA: LEFT lower quadrant pain and bloating,
postmenopausal

EXAM:
TRANSABDOMINAL AND TRANSVAGINAL ULTRASOUND OF PELVIS
TECHNIQUE: Both transabdominal and transvaginal ultrasound examinations of the
pelvis were performed. Transabdominal technique was performed for
global imaging of the pelvis including uterus, ovaries, adnexal
regions, and pelvic cul-de-sac. It was necessary to proceed with
endovaginal exam following the transabdominal exam to visualize the
endometrium and ovaries.

[Series 1: us pelvis complete with transvaginal · 0.24mm/px · 14 of 67 slices shown]
[im 1/67]
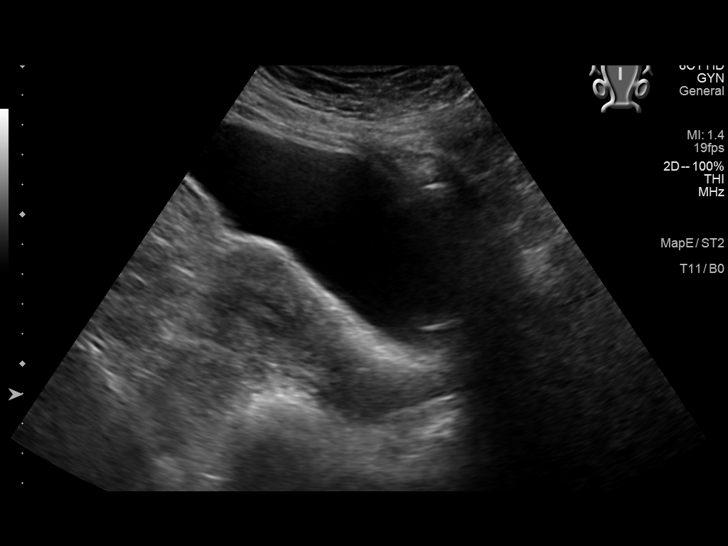
[im 6/67]
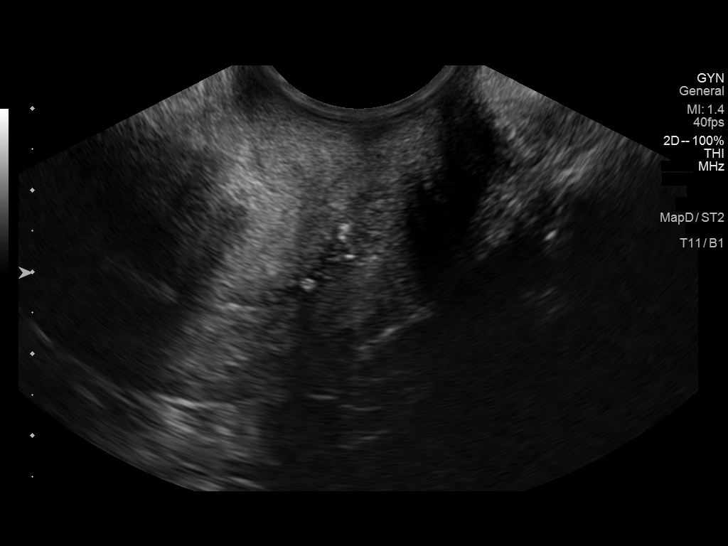
[im 12/67]
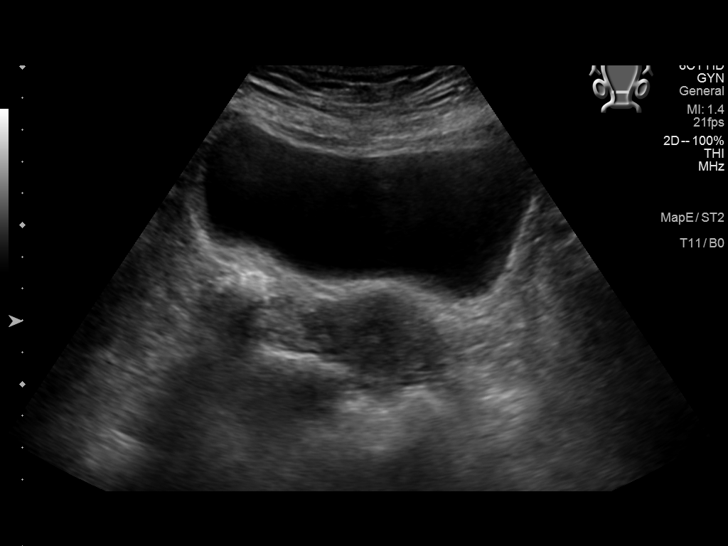
[im 17/67]
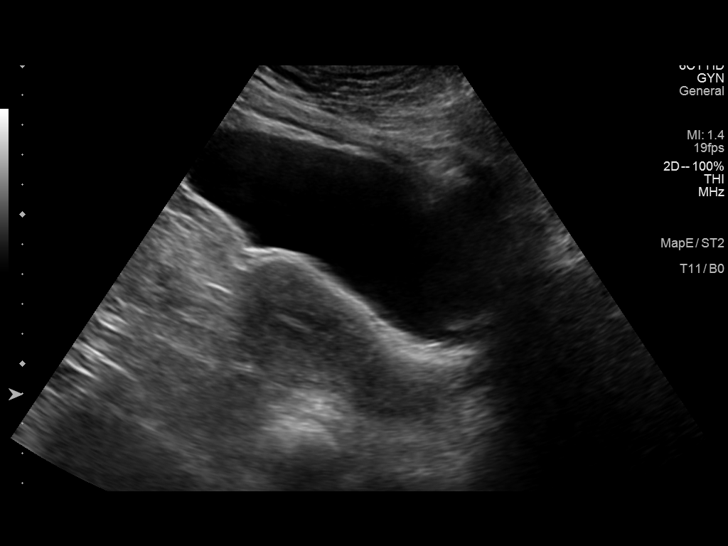
[im 23/67]
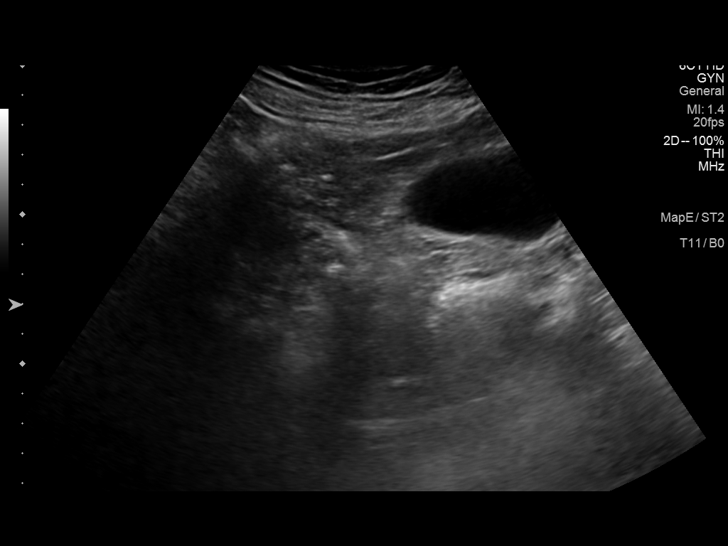
[im 25/67]
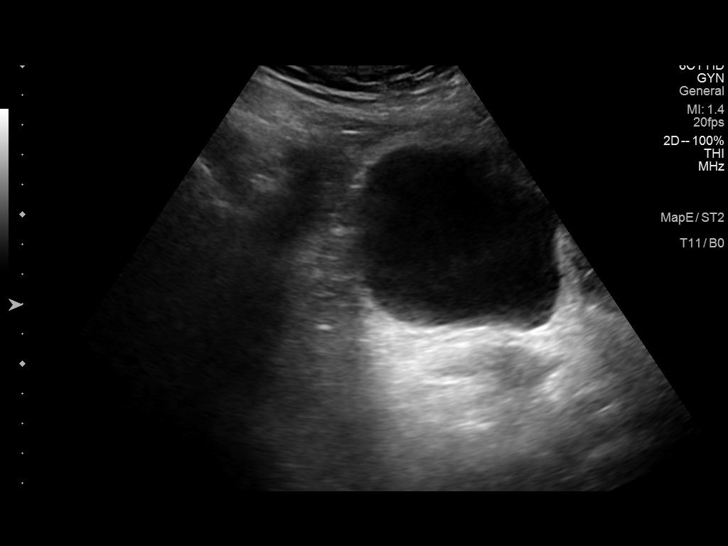
[im 31/67]
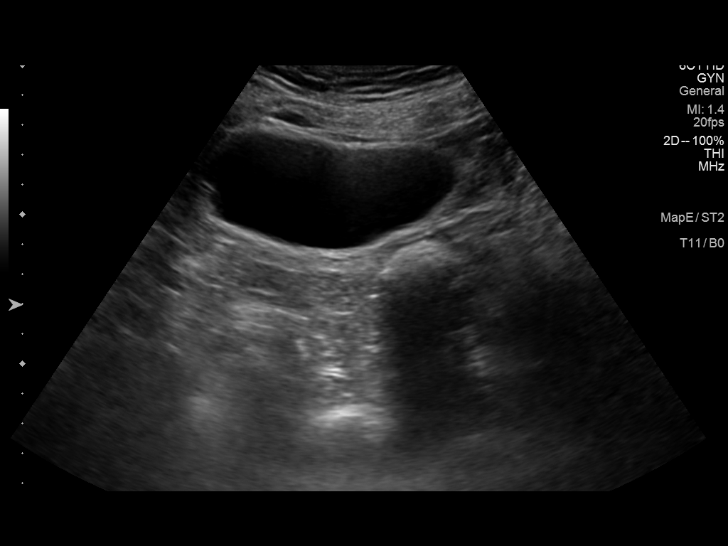
[im 36/67]
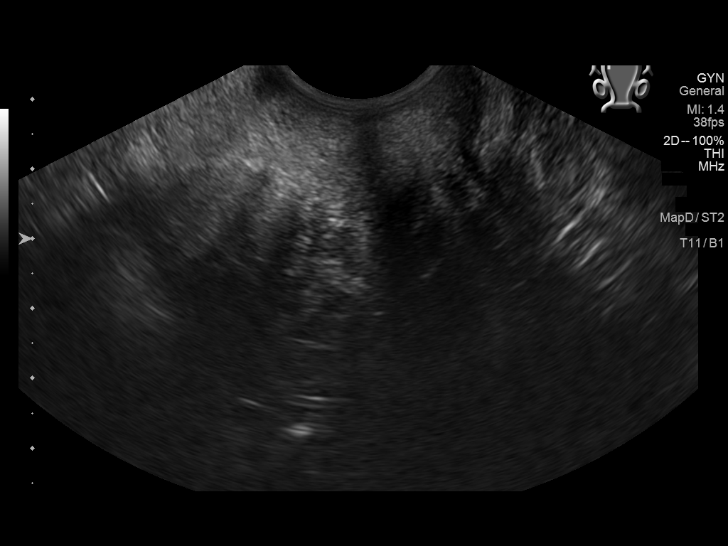
[im 42/67]
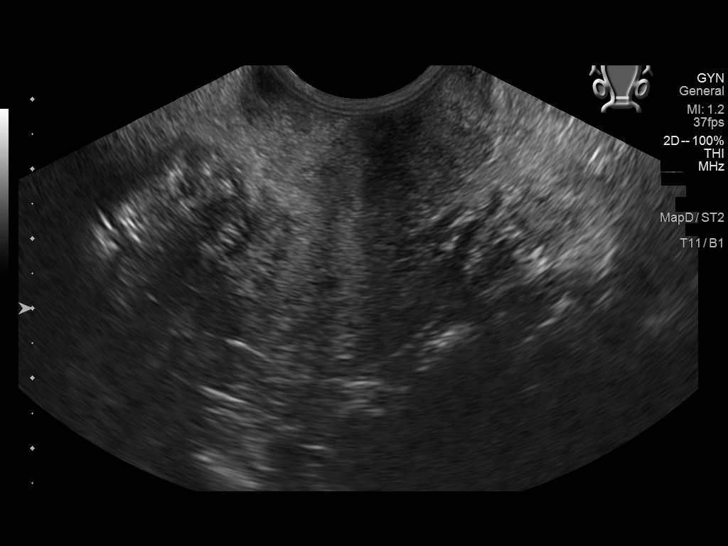
[im 45/67]
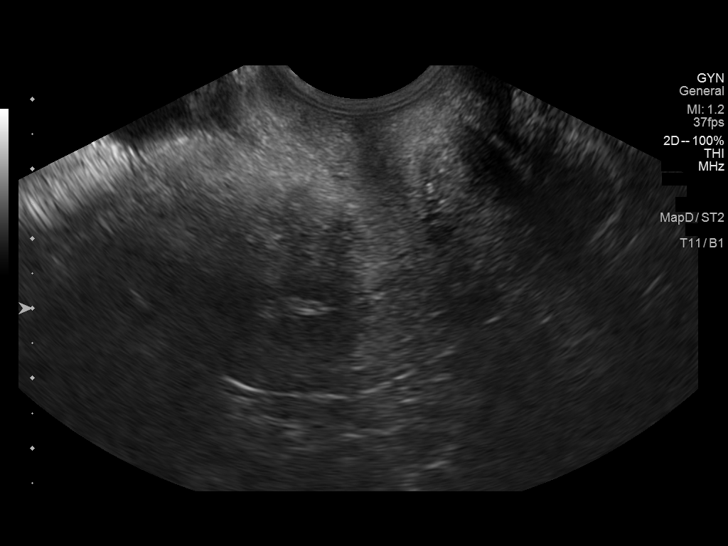
[im 50/67]
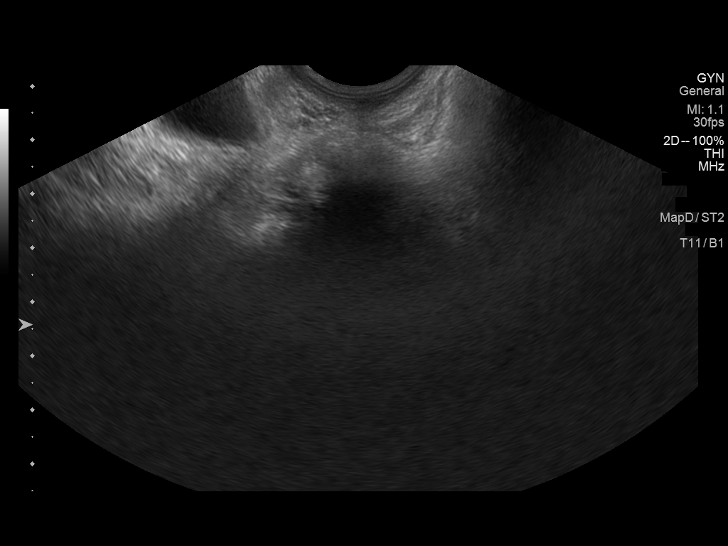
[im 56/67]
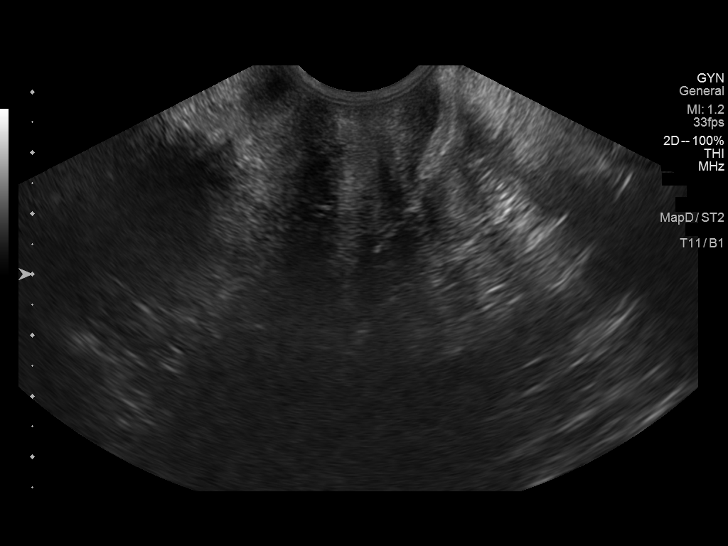
[im 61/67]
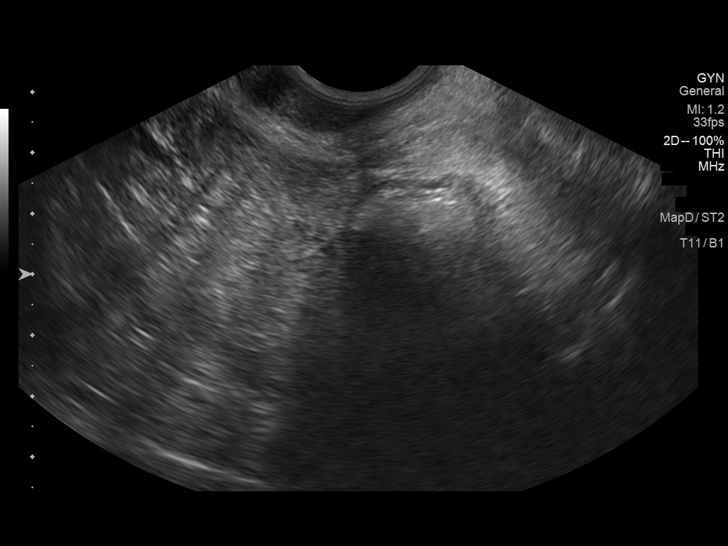
[im 67/67]
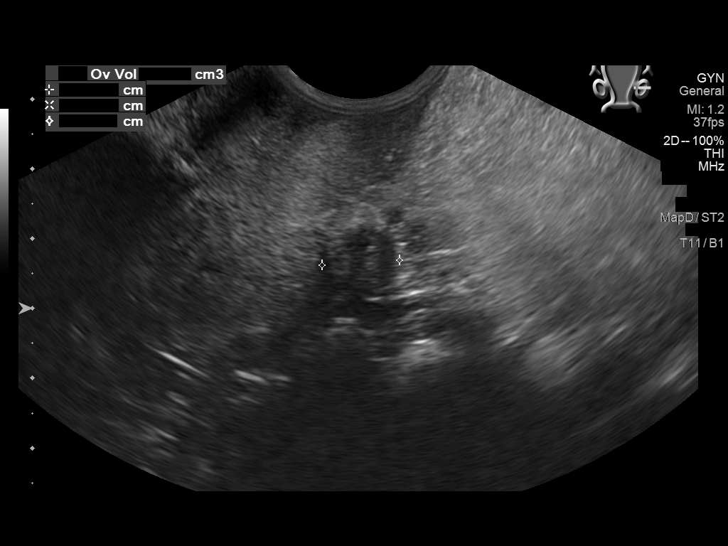

[14 of 25 positions shown; findings below may reference images not displayed]

FINDINGS: Uterus

Measurements: 7.4 x 3.6 x 4.0 cm = volume: 56 mL. Anteverted.
Complex hypoechoic nodule at cervix question complex nabothian cyst.
Mildly heterogeneous myometrium. No additional uterine masses.

Endometrium

Thickness: 4 mm.  No endometrial fluid or focal abnormality

Right ovary

Not visualized, likely obscured by bowel

Left ovary

Measurements: 1.5 x 0.9 x 1.1 cm = volume: 0.8 mL. Normal morphology
without mass

Other findings

No free pelvic fluid.  No adnexal masses.
IMPRESSION: Nonvisualization of RIGHT ovary.

Otherwise negative exam.

## 2020-11-01 DIAGNOSIS — S52602D Unspecified fracture of lower end of left ulna, subsequent encounter for closed fracture with routine healing: Secondary | ICD-10-CM | POA: Insufficient documentation

## 2020-11-01 DIAGNOSIS — T464X5A Adverse effect of angiotensin-converting-enzyme inhibitors, initial encounter: Secondary | ICD-10-CM | POA: Insufficient documentation

## 2020-11-08 ENCOUNTER — Other Ambulatory Visit: Payer: Self-pay | Admitting: Pediatrics

## 2020-11-08 DIAGNOSIS — R7989 Other specified abnormal findings of blood chemistry: Secondary | ICD-10-CM

## 2020-11-19 ENCOUNTER — Other Ambulatory Visit: Payer: Self-pay

## 2020-11-19 ENCOUNTER — Ambulatory Visit
Admission: RE | Admit: 2020-11-19 | Discharge: 2020-11-19 | Disposition: A | Discharge status: Home / Self Care | Payer: Commercial Managed Care - PPO | Source: Ambulatory Visit | Attending: Pediatrics | Admitting: Pediatrics

## 2020-11-19 DIAGNOSIS — R7989 Other specified abnormal findings of blood chemistry: Secondary | ICD-10-CM

## 2020-11-19 IMAGING — US US ABDOMEN LIMITED
1 series · 14 of 25 positions shown · non-contrast
Comparison: None.

CLINICAL DATA: Elevated LFT

EXAM:
ULTRASOUND ABDOMEN LIMITED RIGHT UPPER QUADRANT

[Series 1: us abdomen limited · 0.22mm/px · 14 of 43 slices shown]
[im 1/43]
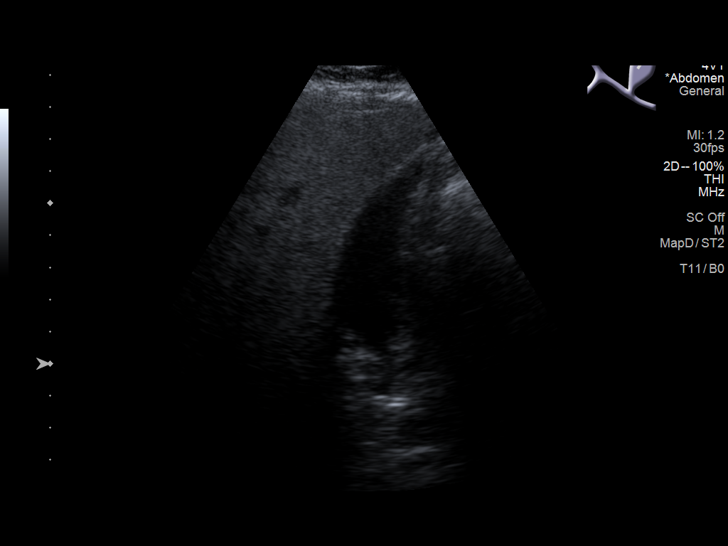
[im 4/43]
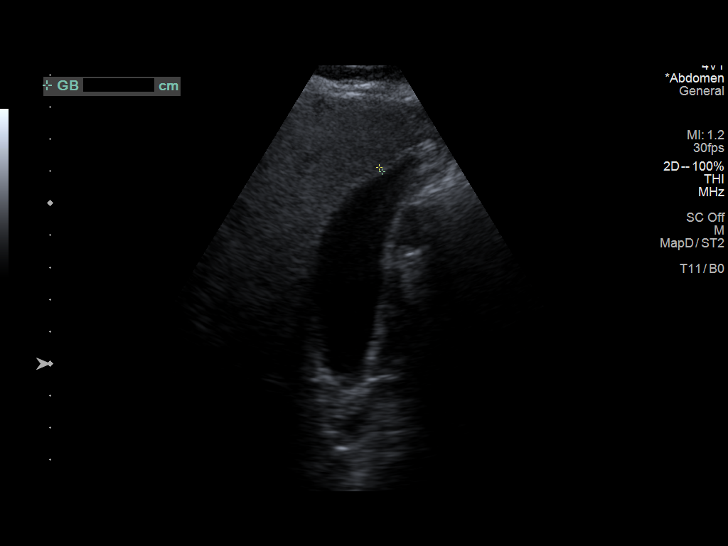
[im 8/43]
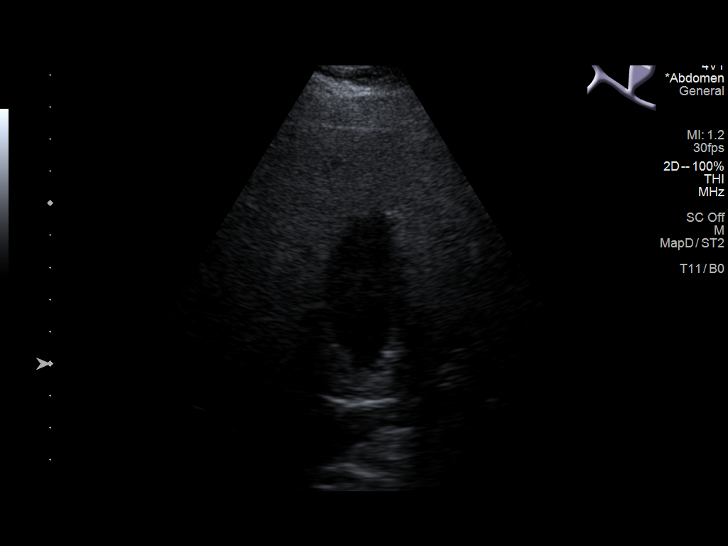
[im 11/43]
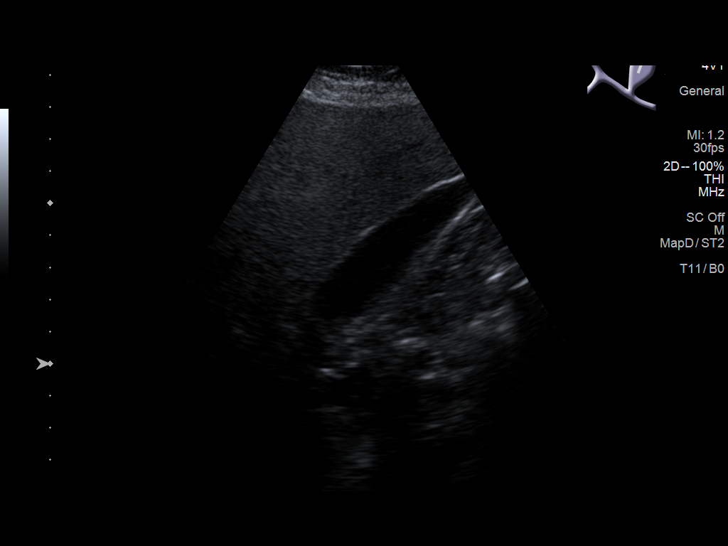
[im 15/43]
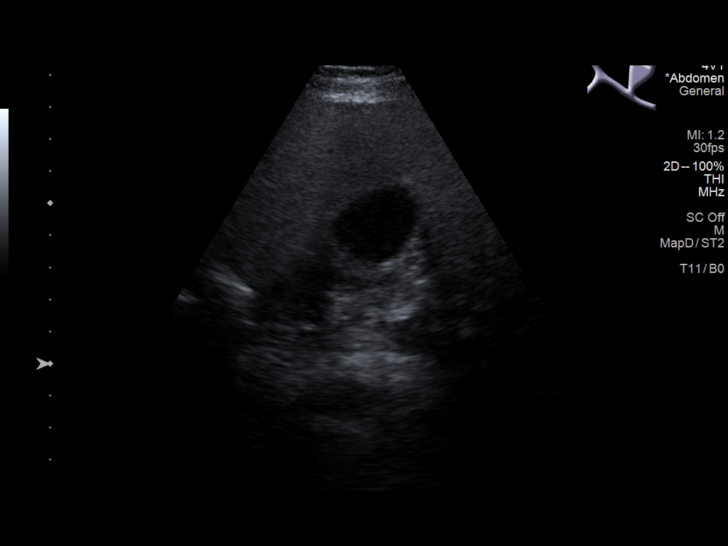
[im 16/43]
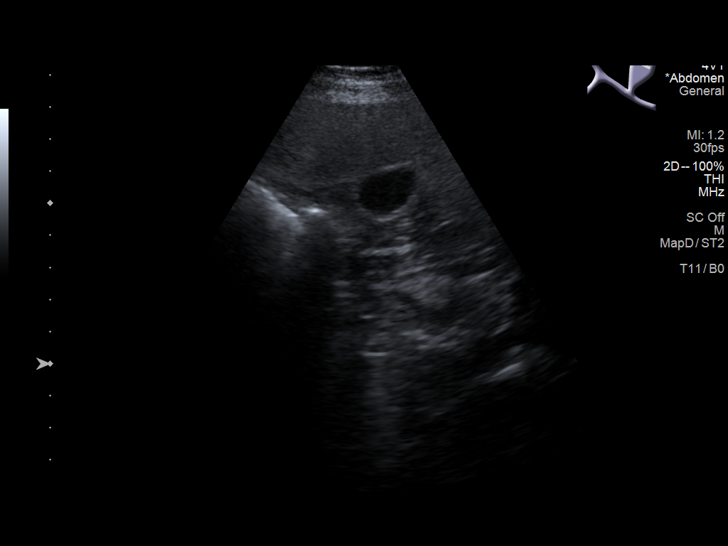
[im 20/43]
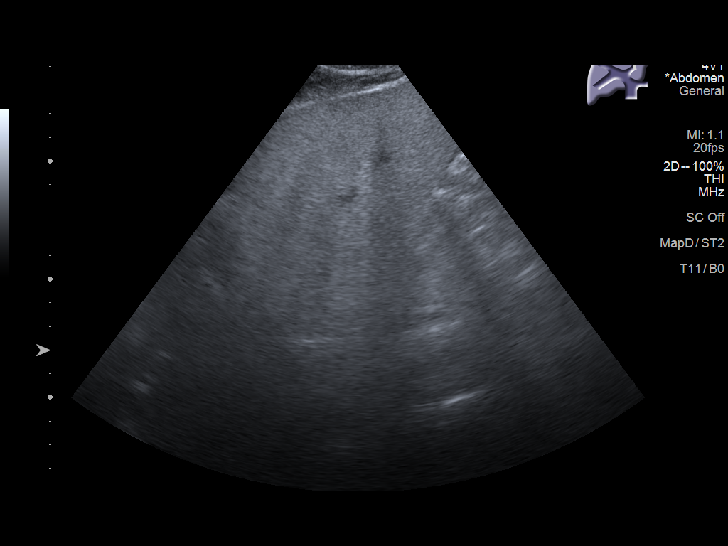
[im 23/43]
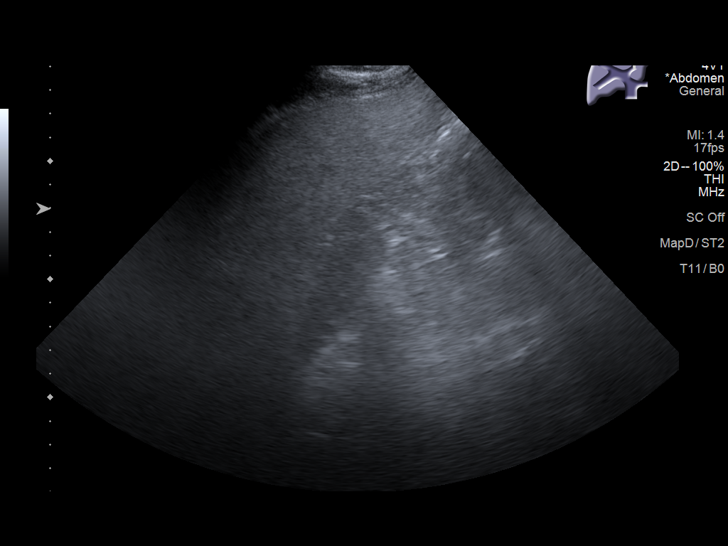
[im 27/43]
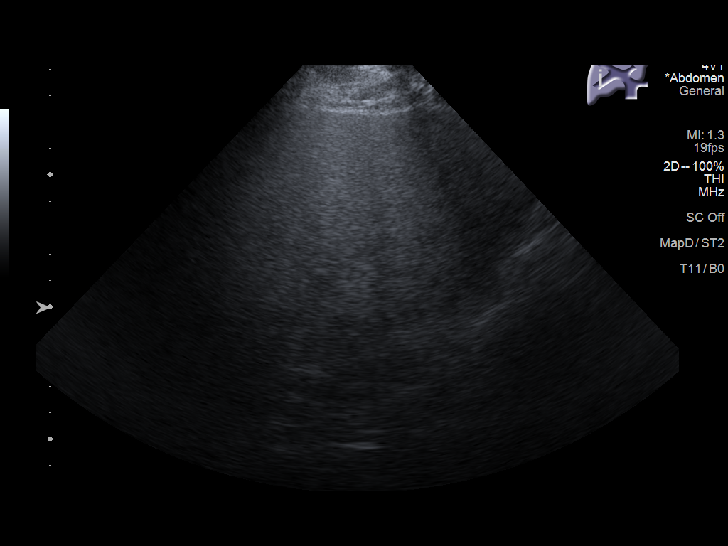
[im 29/43]
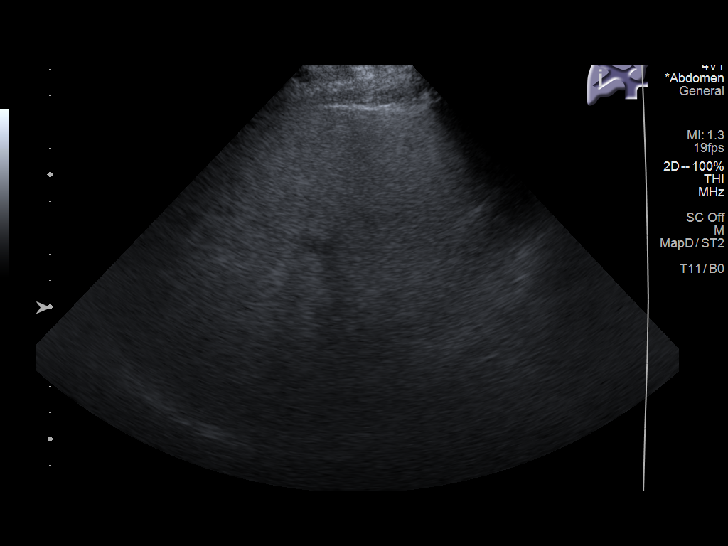
[im 32/43]
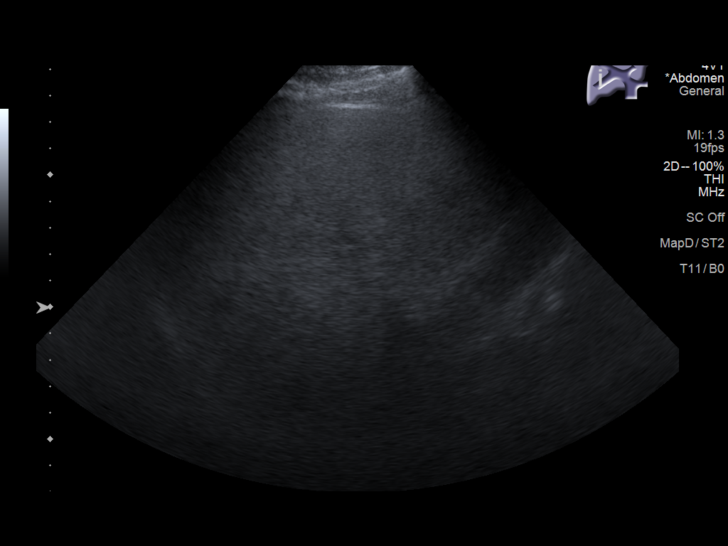
[im 36/43]
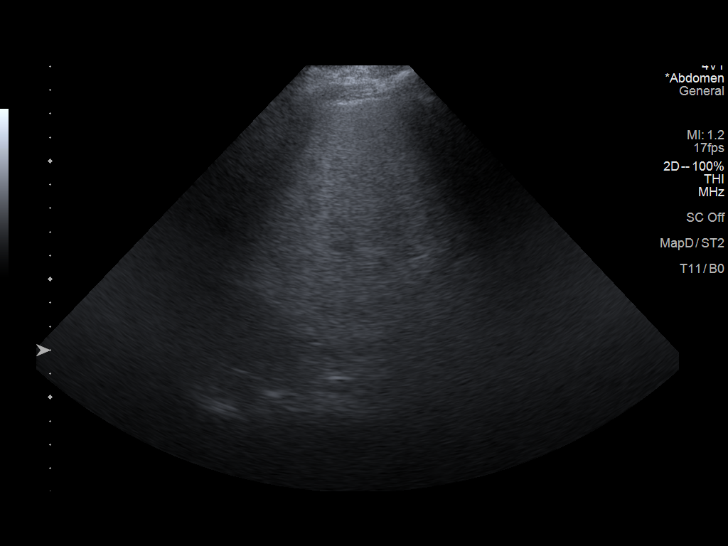
[im 39/43]
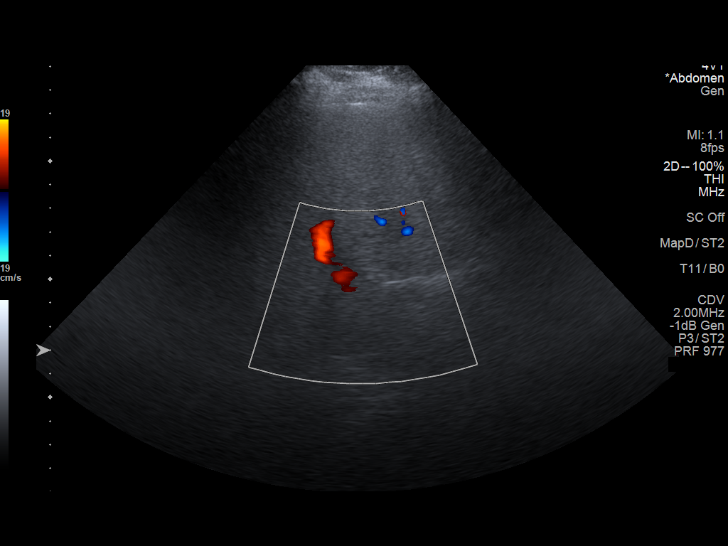
[im 43/43]
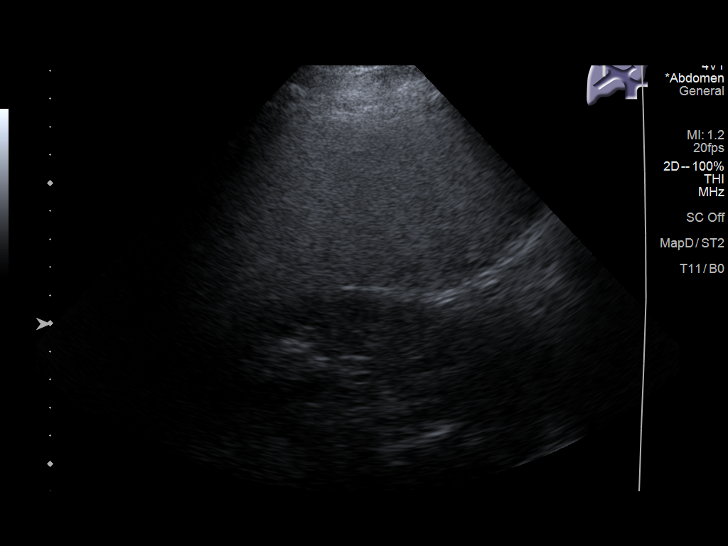

[14 of 25 positions shown; findings below may reference images not displayed]

FINDINGS: Gallbladder:

No gallstones or wall thickening visualized. No sonographic Murphy
sign noted by sonographer.

Common bile duct:

Diameter: 2.9 mm

Liver:

Increased echogenicity liver diffusely without focal liver lesion.
Portal vein is patent on color Doppler imaging with normal direction
of blood flow towards the liver.

Other: Negative for ascites
IMPRESSION: Negative for gallstones

Increased echogenicity liver compatible with  fatty infiltration.

## 2021-01-09 ENCOUNTER — Other Ambulatory Visit: Payer: Self-pay | Admitting: Pediatrics

## 2021-01-09 DIAGNOSIS — Z1231 Encounter for screening mammogram for malignant neoplasm of breast: Secondary | ICD-10-CM

## 2021-02-07 ENCOUNTER — Other Ambulatory Visit: Payer: Self-pay

## 2021-02-07 ENCOUNTER — Ambulatory Visit
Admission: RE | Admit: 2021-02-07 | Discharge: 2021-02-07 | Disposition: A | Discharge status: Home / Self Care | Payer: Commercial Managed Care - PPO | Source: Ambulatory Visit | Attending: Pediatrics | Admitting: Pediatrics

## 2021-02-07 DIAGNOSIS — Z1231 Encounter for screening mammogram for malignant neoplasm of breast: Secondary | ICD-10-CM | POA: Insufficient documentation

## 2021-02-07 IMAGING — MG MM DIGITAL SCREENING BILAT W/ TOMO AND CAD
8 series · 8 of 24 positions shown · non-contrast
Comparison: Previous exam(s).

CLINICAL DATA: Screening.

EXAM:
DIGITAL SCREENING BILATERAL MAMMOGRAM WITH TOMOSYNTHESIS AND CAD
TECHNIQUE: Bilateral screening digital craniocaudal and mediolateral oblique
mammograms were obtained. Bilateral screening digital breast
tomosynthesis was performed. The images were evaluated with
computer-aided detection.

[L CC synth-2D]
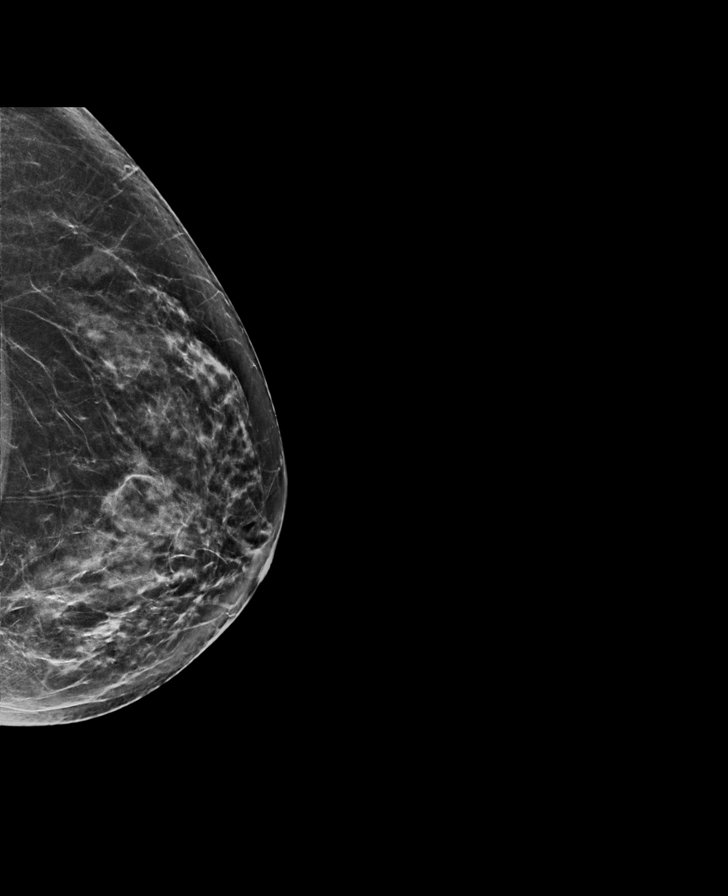

[R MLO synth-2D]
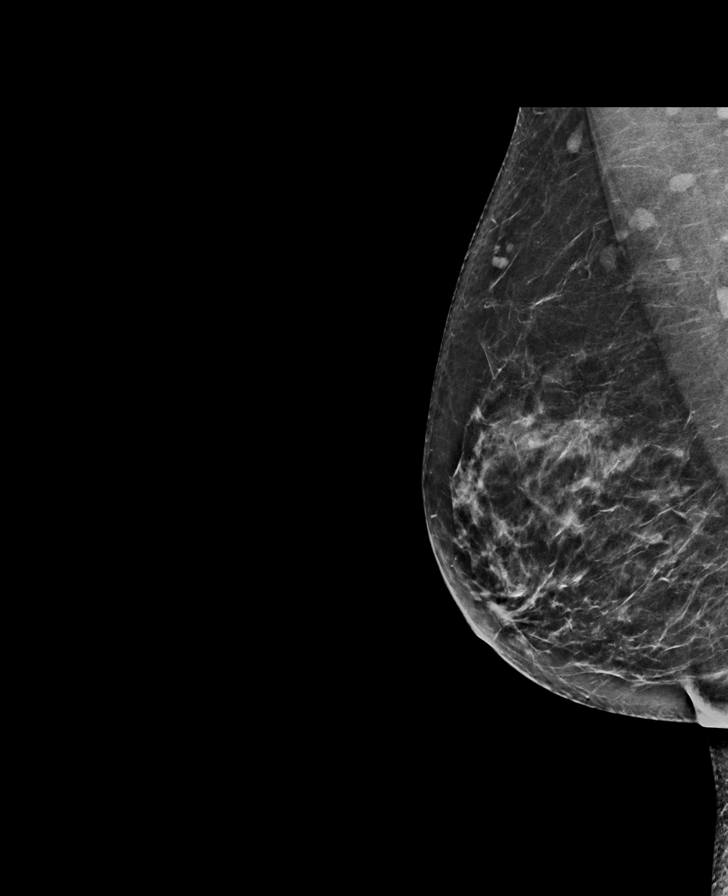

[L MLO synth-2D]
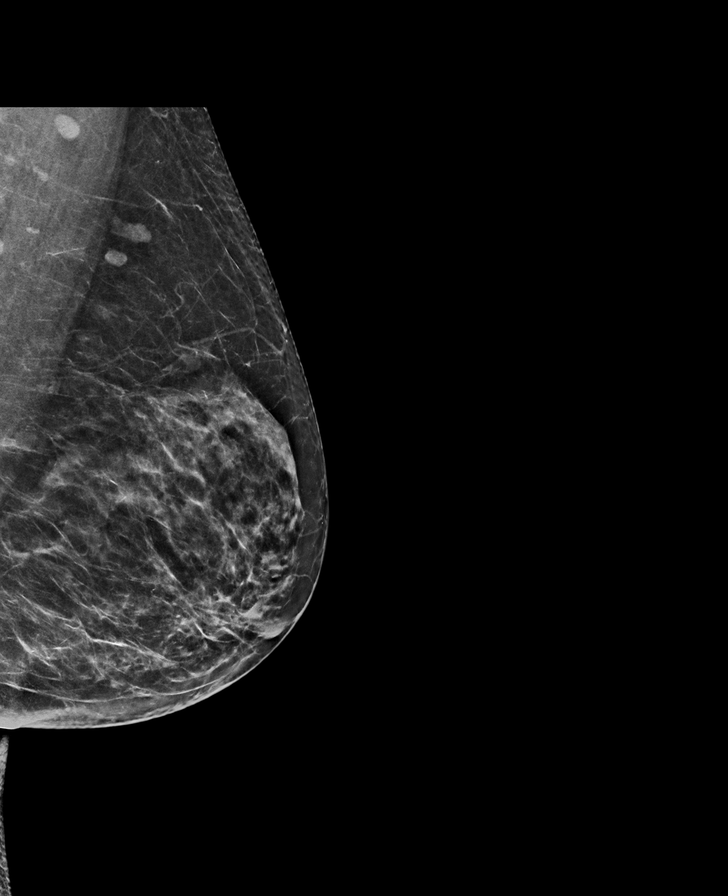

[R CC synth-2D]
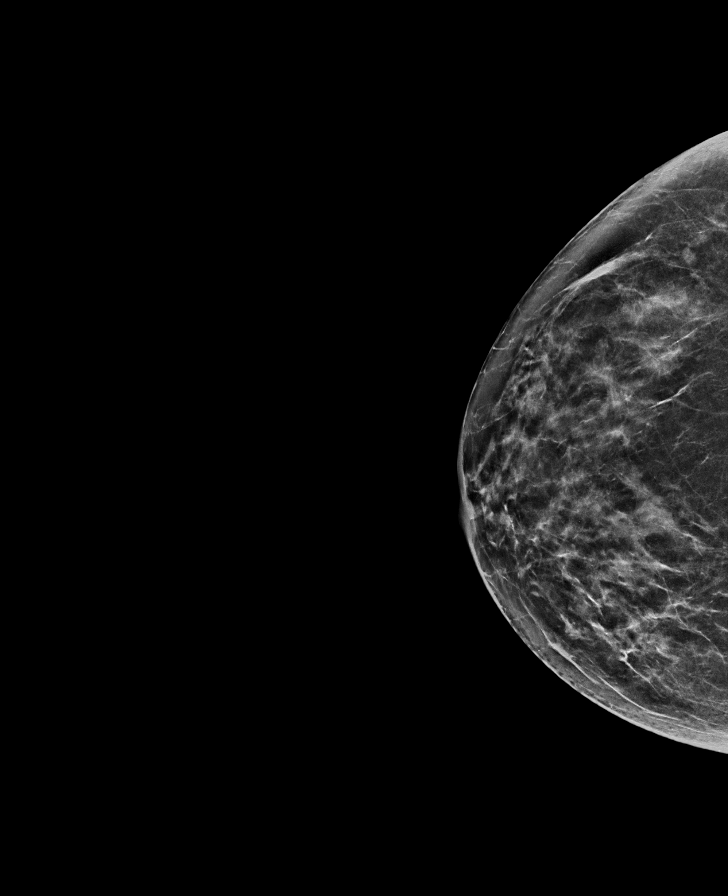

[L MLO tomo · tomo slice 35/69.0]
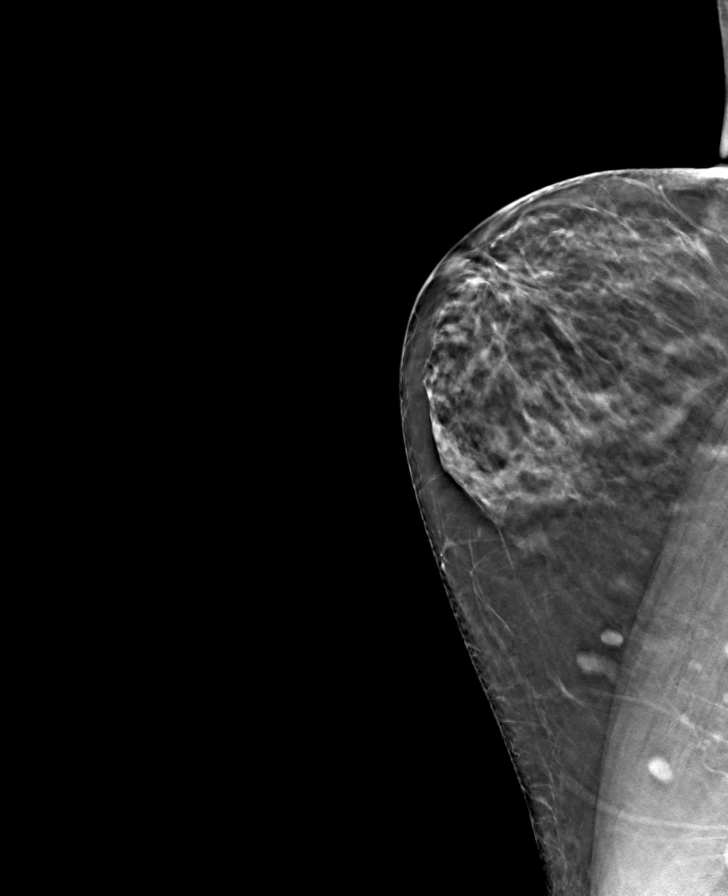

[R CC tomo · tomo slice 31/62.0]
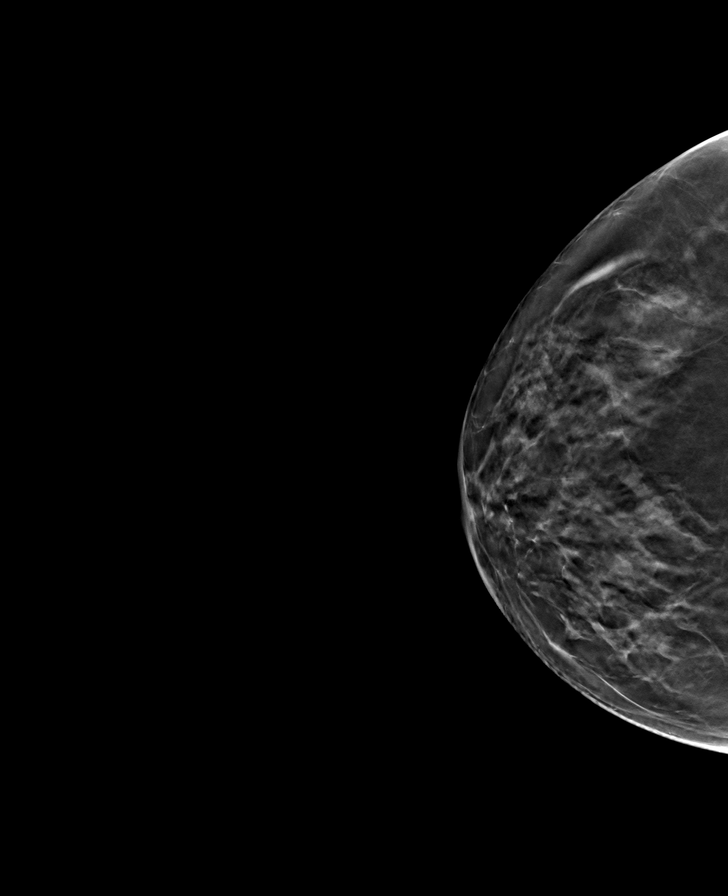

[L CC tomo · tomo slice 35/68.0]
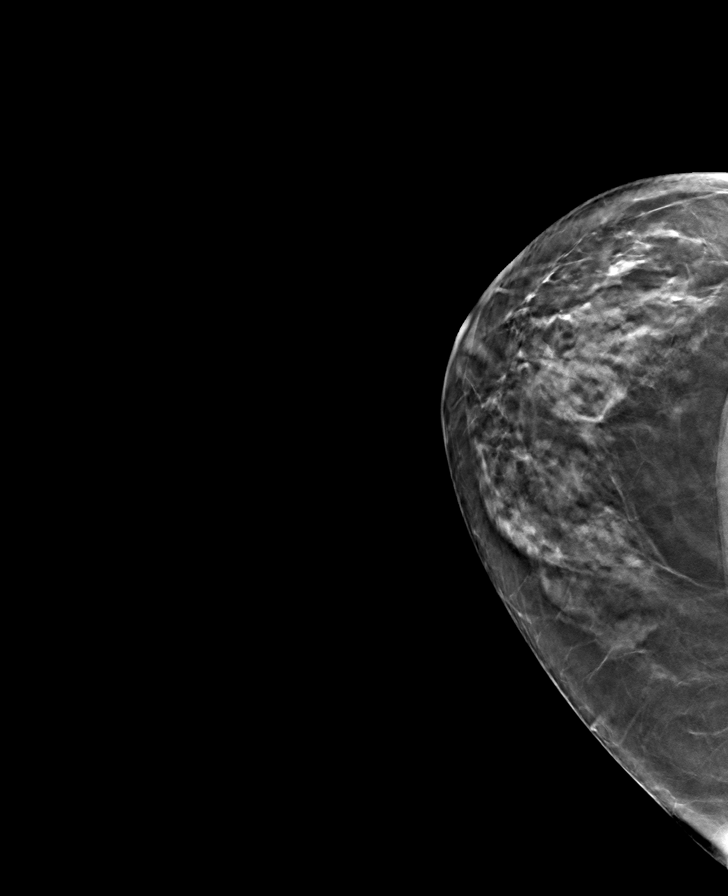

[R MLO tomo · tomo slice 37/72.0]
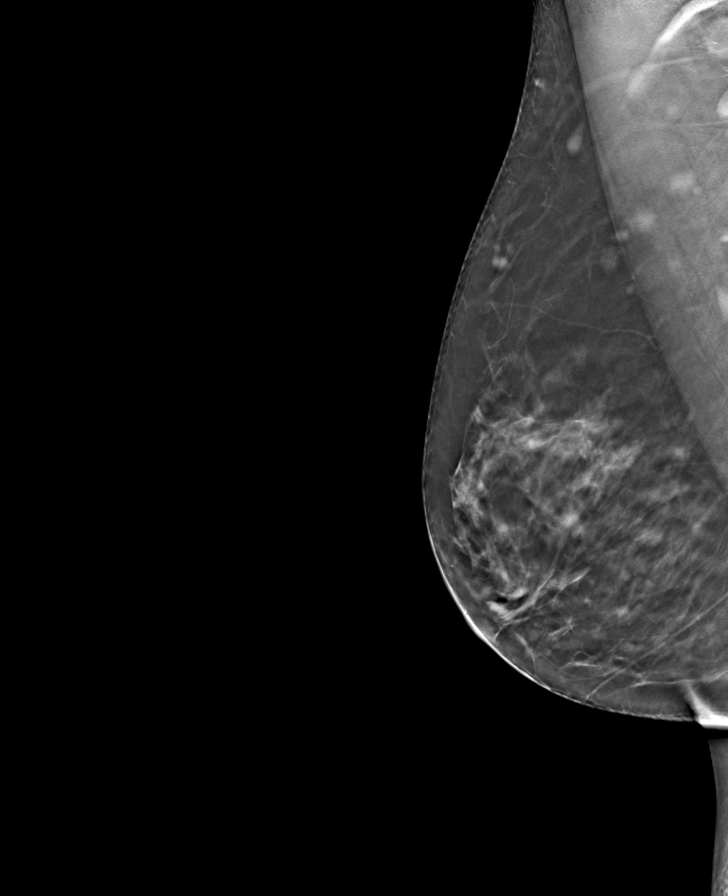

[8 of 24 positions shown; findings below may reference images not displayed]

ACR Breast Density Category c: The breast tissue is heterogeneously
dense, which may obscure small masses.
FINDINGS: There are no findings suspicious for malignancy.
IMPRESSION: No mammographic evidence of malignancy. A result letter of this
screening mammogram will be mailed directly to the patient.

RECOMMENDATION:
Screening mammogram in one year. (Code:[V2])

BI-RADS CATEGORY  1: Negative.

## 2021-02-24 ENCOUNTER — Encounter: Payer: Self-pay | Admitting: *Deleted

## 2021-11-21 ENCOUNTER — Inpatient Hospital Stay: Payer: Commercial Managed Care - PPO | Attending: Internal Medicine | Admitting: Internal Medicine

## 2021-11-21 ENCOUNTER — Other Ambulatory Visit: Payer: Self-pay

## 2021-11-21 ENCOUNTER — Encounter: Payer: Self-pay | Admitting: Internal Medicine

## 2021-11-21 ENCOUNTER — Inpatient Hospital Stay: Payer: Commercial Managed Care - PPO

## 2021-11-21 DIAGNOSIS — E119 Type 2 diabetes mellitus without complications: Secondary | ICD-10-CM | POA: Diagnosis not present

## 2021-11-21 DIAGNOSIS — Z800 Family history of malignant neoplasm of digestive organs: Secondary | ICD-10-CM | POA: Insufficient documentation

## 2021-11-21 DIAGNOSIS — C787 Secondary malignant neoplasm of liver and intrahepatic bile duct: Secondary | ICD-10-CM

## 2021-11-21 DIAGNOSIS — Z7984 Long term (current) use of oral hypoglycemic drugs: Secondary | ICD-10-CM | POA: Insufficient documentation

## 2021-11-21 DIAGNOSIS — Z803 Family history of malignant neoplasm of breast: Secondary | ICD-10-CM | POA: Diagnosis not present

## 2021-11-21 LAB — COMPREHENSIVE METABOLIC PANEL
ALT: 44 U/L (ref 0–44)
AST: 35 U/L (ref 15–41)
Albumin: 4.4 g/dL (ref 3.5–5.0)
Alkaline Phosphatase: 92 U/L (ref 38–126)
Anion gap: 9 (ref 5–15)
BUN: 15 mg/dL (ref 8–23)
CO2: 28 mmol/L (ref 22–32)
Calcium: 9.7 mg/dL (ref 8.9–10.3)
Chloride: 99 mmol/L (ref 98–111)
Creatinine, Ser: 0.79 mg/dL (ref 0.44–1.00)
GFR, Estimated: >60 mL/min (ref >60)
Glucose, Bld: 88 mg/dL (ref 70–99)
Potassium: 4.6 mmol/L (ref 3.5–5.1)
Sodium: 136 mmol/L (ref 135–145)
Total Bilirubin: <0.1 mg/dL — ABNORMAL LOW (ref 0.3–1.2)
Total Protein: 7.3 g/dL (ref 6.5–8.1)

## 2021-11-21 LAB — FERRITIN: Ferritin: 286 ng/mL (ref 11–307)

## 2021-11-21 LAB — CBC WITH DIFFERENTIAL/PLATELET
Abs Immature Granulocytes: 0.02 10*3/uL (ref 0.00–0.07)
Basophils Absolute: 0.1 10*3/uL (ref 0.0–0.1)
Basophils Relative: 1 %
Eosinophils Absolute: 0.3 10*3/uL (ref 0.0–0.5)
Eosinophils Relative: 4 %
HCT: 38.7 % (ref 36.0–46.0)
Hemoglobin: 13.1 g/dL (ref 12.0–15.0)
Immature Granulocytes: 0 %
Lymphocytes Relative: 26 %
Lymphs Abs: 2.1 10*3/uL (ref 0.7–4.0)
MCH: 33.4 pg (ref 26.0–34.0)
MCHC: 33.9 g/dL (ref 30.0–36.0)
MCV: 98.7 fL (ref 80.0–100.0)
Monocytes Absolute: 0.5 10*3/uL (ref 0.1–1.0)
Monocytes Relative: 6 %
Neutro Abs: 5 10*3/uL (ref 1.7–7.7)
Neutrophils Relative %: 63 %
Platelets: 276 10*3/uL (ref 150–400)
RBC: 3.92 MIL/uL (ref 3.87–5.11)
RDW: 11.5 % (ref 11.5–15.5)
WBC: 8 10*3/uL (ref 4.0–10.5)
nRBC: 0 % (ref 0.0–0.2)

## 2021-11-21 LAB — IRON AND TIBC
Iron: 129 ug/dL (ref 28–170)
Saturation Ratios: 39 % — ABNORMAL HIGH (ref 10.4–31.8)
TIBC: 332 ug/dL (ref 250–450)
UIBC: 203 ug/dL

## 2021-11-21 LAB — LACTATE DEHYDROGENASE: LDH: 132 U/L (ref 98–192)

## 2021-11-21 NOTE — Progress Notes (Signed)
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    Comment: Hereditary hemochromatosis is an autosomal recessive disorder of iron metabolism that varies in clinical severity. Two mutations in the HFE gene, p.C282Y (p.Cys282Tyr) and p.H63D (p.His63Asp), are associated with a clinical diagnosis of hemochromatosis. In European Caucasians, about 80-90% of individuals with clinical symptoms of primary hemochromatosis are homozygous for the C282Y mutation. Individuals with both C282Y and H63D mutations (compound heterozygotes, C282Y/H63D) may develop clinical symptoms of hemochromatosis. Patients with a single C282Y or H63D mutation (heterozygotes) or two H63D mutations (H63D/H63D homozygotes) generally do not develop clinical symptoms of hemochromatosis. Disease presentation, however, can be affected by mutations in the HFE gene or other genes involved in iron metabolism   HISTORY OF PRESENTING ILLNESS: Ambulating independently.  Accompanied by sister-in-law. Melanie Morales 60 y.o.  female has been referred to [place] for further evaluation/work-up/ recommendations for elevated ferritin/hemochromatosis  Patient's brother a history of hemochromatosis /patient at the cancer center ?  Liver cancer -again as per patient.  In April 2022 patient noted to have elevated ferritin and iron studies /and also positive for hemochromatosis genotyping.   Patient denies any signs and symptoms of cirrhosis; denies any swelling in the legs or abdominal distention. Patient  does not take any iron containing multivitamins. Denies any skin rash or unusual arthritic symptoms.  Personal Hx of liver disease: none Hepatitis B/C: None Alcohol: rare Family Hx of Liver  disease or cancer: brother liver cancer; and also Hx of hemochromatosis.    Review of Systems  Constitutional:  Negative for chills, diaphoresis, fever, malaise/fatigue and weight loss.  HENT:  Negative for nosebleeds and sore throat.   Eyes:  Negative for double vision.  Respiratory:  Negative for cough, hemoptysis, sputum production, shortness of breath and wheezing.   Cardiovascular:  Negative for chest pain, palpitations, orthopnea and leg swelling.  Gastrointestinal:  Negative for abdominal pain, blood in stool, constipation, diarrhea, heartburn, melena, nausea and vomiting.  Genitourinary:  Negative for dysuria, frequency and urgency.  Musculoskeletal:  Positive for joint pain. Negative for back pain.  Skin: Negative.  Negative for itching and rash.  Neurological:  Negative for dizziness, tingling, focal weakness, weakness and headaches.  Endo/Heme/Allergies:  Does not bruise/bleed easily.  Psychiatric/Behavioral:  Negative for depression. The patient is not nervous/anxious and does not have insomnia.    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    GERD (gastroesophageal reflux disease)     SURGICAL HISTORY: Past Surgical History:  Procedure Laterality Date   APPENDECTOMY     COLONOSCOPY WITH PROPOFOL N/A 09/24/2017   Procedure: COLONOSCOPY WITH PROPOFOL;  Surgeon: Lollie Sails, MD;  Location[facility] ENDOSCOPY;  Service: Endoscopy;  Laterality: N/A;   ESOPHAGOGASTRODUODENOSCOPY (EGD) WITH PROPOFOL N/A 07/26/2015   Procedure: ESOPHAGOGASTRODUODENOSCOPY (EGD) WITH PROPOFOL;  Surgeon: Lollie Sails, MD;  Location: [facility] ENDOSCOPY;  Service: Endoscopy;  Laterality: N/A;   ESOPHAGOGASTRODUODENOSCOPY (EGD) WITH PROPOFOL N/A 09/24/2017   Procedure: ESOPHAGOGASTRODUODENOSCOPY (EGD) WITH PROPOFOL;  Surgeon: Lollie Sails, MD;  Location: [facility]  ENDOSCOPY;  Service: Endoscopy;  Laterality: N/A;   left carpal tunnel release      SOCIAL  HISTORY: Social History   Socioeconomic History   Marital status: Single    Spouse name: Not on file   Number of children: Not on file   Years of education: Not on file   Highest education level: Not on file  Occupational History   Not on file  Tobacco Use   Smoking status: Former    Packs/day: 0.50    Types: Cigarettes    Quit date: 10/31/2019    Years since quitting: 2.0   Smokeless tobacco: Never  Substance and Sexual Activity   Alcohol use: No   Drug use: No   Sexual activity: Not on file  Other Topics Concern   Not on file  Social History Narrative   Rare alcohol; used to work at [employer]; quit in NOV, 2018 [smoked 30 years]. Lives in [place].    Social Determinants of Health   Financial Resource Strain: Not on file  Food Insecurity: Not on file  Transportation Needs: Not on file  Physical Activity: Not on file  Stress: Not on file  Social Connections: Not on file  Intimate Partner Violence: Not on file    FAMILY HISTORY: Family History  Problem Relation Age of Onset   Rectal cancer Sister    Liver cancer Brother    Head & neck cancer Brother    Breast cancer Paternal Aunt 59    ALLERGIES:  is allergic to ace inhibitors.  MEDICATIONS:  Current Outpatient Medications  Medication Sig Dispense Refill   amitriptyline (ELAVIL) 100 MG tablet Take 100 mg by mouth at bedtime.     FLUoxetine (PROZAC) 20 MG capsule Take 20 mg by mouth daily.     fluticasone (FLONASE) 50 MCG/ACT nasal spray Place into the nose.     metFORMIN (GLUCOPHAGE-XR) 500 MG 24 hr tablet Take by mouth.     omeprazole (PRILOSEC) 20 MG capsule Take 20 mg by mouth daily.     oxybutynin (DITROPAN) 5 MG tablet Take 5 mg by mouth 3 (three) times daily.     pramipexole (MIRAPEX) 0.25 MG tablet Take 0.25 mg by mouth at bedtime.     pravastatin (PRAVACHOL) 20 MG tablet Take 20 mg by mouth daily.      valsartan (DIOVAN) 80 MG tablet Take by mouth.     No current facility-administered medications for this visit.      PHYSICAL EXAMINATION:   Vitals:   11/21/21 1118  BP: 126/71  Pulse: 89  Resp: 18  Temp: 98 F (36.7 C)   Filed Weights   11/21/21 1118  Weight: 160 lb (72.6 kg)    Physical Exam Vitals and nursing note reviewed.  HENT:     Head: Normocephalic and atraumatic.     Mouth/Throat:     Pharynx: Oropharynx is clear.  Eyes:     Extraocular Movements: Extraocular movements intact.     Pupils: Pupils are equal, round, and reactive to light.  Cardiovascular:     Rate and Rhythm: Normal rate and regular rhythm.  Pulmonary:     Comments: Decreased breath sounds bilaterally.  Abdominal:     Palpations: Abdomen is soft.  Musculoskeletal:        General: Normal range of motion.     Cervical back: Normal range of motion.  Skin:    General: Skin is warm.  Neurological:     General: No focal deficit present.     Mental Status: She is alert and oriented to person, place, and time.  Psychiatric:  Behavior: Behavior normal.        Judgment: Judgment normal.    LABORATORY DATA:  I have reviewed the data as listed Lab Results  Component Value Date   WBC 7.7 03/20/2014   HGB 13.2 03/20/2014   HCT 38.7 03/20/2014   MCV 97 03/20/2014   PLT 242 03/20/2014   No results for input(s): NA, K, CL, CO2, GLUCOSE, BUN, CREATININE, CALCIUM, GFRNONAA, GFRAA, PROT, ALBUMIN, AST, ALT, ALKPHOS, BILITOT, BILIDIR, IBILI in the last 8760 hours.   No results found.  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  No evidence of organ dysfunction.  Recommend MRI of the liver for further evaluation/iron overload.   I had a long discussion the patient regarding the patho-biology of hemochromatosis- the fact that elevated iron levels over many years could lead to deposition in the organs leading to-cirrhosis,  arthralgias, skin pigmentation, diabetes; central endocrine abnormalities; heart failure etc. However, I'm not overtly concerned about development of clinical hemochromatosis/iron overload in the patient.  #Discussed with the patient/and family that decisions regarding phlebotomy to be made based on liver MRI repeat iron number etc.   Thank you Dr.Behling for allowing me to participate in the care of your pleasant patient. Please do not hesitate to contact me with questions or concerns in the interim.  # DISPOSITION: # labs today # MRI in dec last week- # follow up 2 week of JAN 2023- MD; possible phlebotomy; no labs- Dr.B    All questions were answered. The patient knows to call the clinic with any problems, questions or concerns.      Cammie Sickle, MD 11/21/2021 11:55 AM

## 2021-11-21 NOTE — Assessment & Plan Note (Addendum)
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  No evidence of organ dysfunction.  Recommend MRI of the liver for further evaluation/iron overload.   I had a long discussion the patient regarding the patho-biology of hemochromatosis- the fact that elevated iron levels over many years could lead to deposition in the organs leading to-cirrhosis, arthralgias, skin pigmentation, diabetes; central endocrine abnormalities; heart failure etc. However, I'm not overtly concerned about development of clinical hemochromatosis/iron overload in the patient.  #Discussed with the patient/and family that decisions regarding phlebotomy to be made based on liver MRI repeat iron number etc.   Thank you Dr.Behling for allowing me to participate in the care of your pleasant patient. Please do not hesitate to contact me with questions or concerns in the interim.  # DISPOSITION: # labs today # MRI in dec last week- # follow up 2 week of JAN 2023- MD; possible phlebotomy; no labs- Dr.B

## 2021-11-21 NOTE — Progress Notes (Signed)
New patient evaluation.   

## 2021-12-17 ENCOUNTER — Ambulatory Visit
Admission: RE | Admit: 2021-12-17 | Discharge: 2021-12-17 | Disposition: A | Discharge status: Home / Self Care | Payer: Commercial Managed Care - PPO | Source: Ambulatory Visit | Attending: Internal Medicine | Admitting: Internal Medicine

## 2021-12-17 ENCOUNTER — Other Ambulatory Visit: Payer: Self-pay

## 2021-12-17 DIAGNOSIS — C787 Secondary malignant neoplasm of liver and intrahepatic bile duct: Secondary | ICD-10-CM | POA: Insufficient documentation

## 2021-12-17 IMAGING — MR MR ABDOMEN WO/W CM
18 series · 48 of 48 positions shown · IV contrast (7ml Gadavist)
Comparison: Abdominal ultrasound [DATE]

CLINICAL DATA: Suspected liver metastases. History of breast
cancer.

EXAM:
MRI ABDOMEN WITHOUT AND WITH CONTRAST
TECHNIQUE: Multiplanar multisequence MR imaging of the abdomen was performed
both before and after the administration of intravenous contrast.
CONTRAST:  7mL GADAVIST GADOBUTROL 1 MMOL/ML IV SOLN

[Series 3: T2 · coronal · 6.0mm · 1.19mm/px · 2 of 32 slices shown (1 of 2)]
[im 1/32]
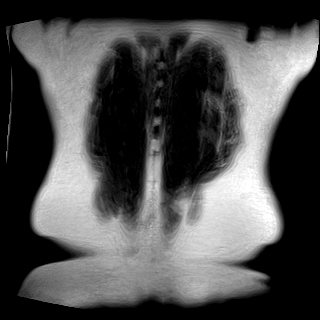
[im 32/32]
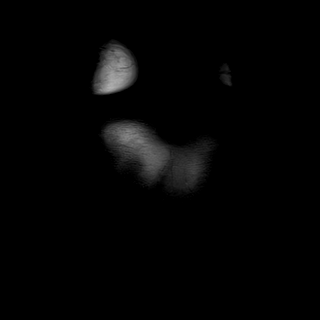

[Series 4: T2 · axial · 6.0mm · 1.19mm/px · z∈[-40,+205]mm · 2 of 35 slices shown (2 of 2)]
[im 1/35]
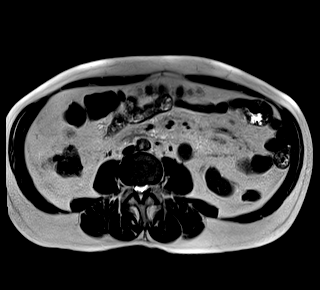
[im 35/35]
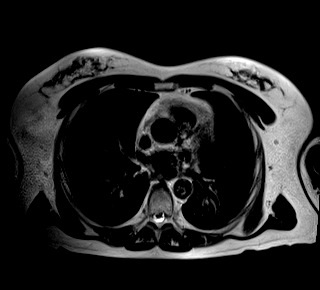

[Series 6: T2 fat-sat · axial · 6.0mm · 1.19mm/px · z∈[-43,+224]mm · 2 of 38 slices shown]
[im 1/38]
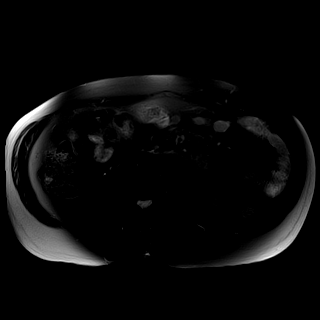
[im 38/38]
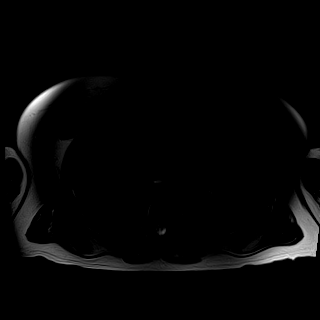

[Series 7: ax dwi_tracew · axial · 6.0mm · 1.42mm/px · z∈[-43,+224]mm · 4 of 114 slices shown]
[im 1/114]
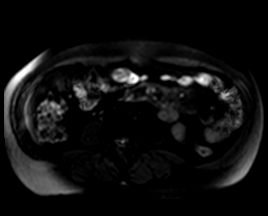
[im 38/114]
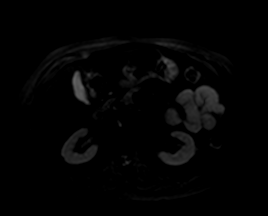
[im 76/114]
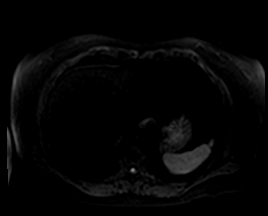
[im 114/114]
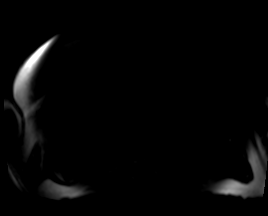

[Series 8: ax dwi_adc · axial · 6.0mm · 1.42mm/px · 1 of 38 slices shown]
[im 1/38]
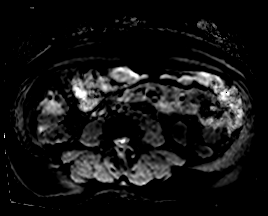

[Series 9: in & out · axial · 3.0mm · 1.19mm/px · z∈[-48,+213]mm · 3 of 88 slices shown (1 of 2)]
[im 1/88]
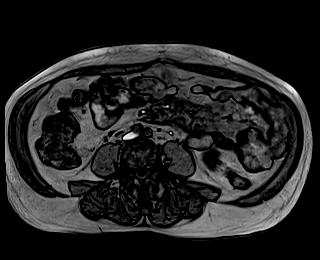
[im 44/88]
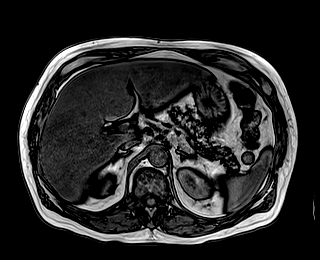
[im 88/88]
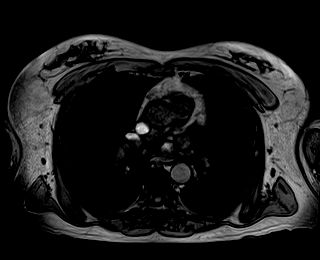

[Series 10: in & out · axial · 3.0mm · 1.19mm/px · z∈[-48,+213]mm · 3 of 88 slices shown (2 of 2)]
[im 1/88]
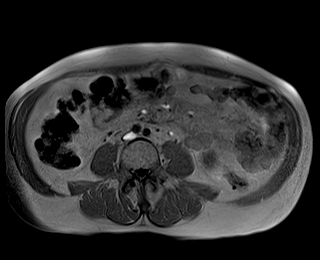
[im 44/88]
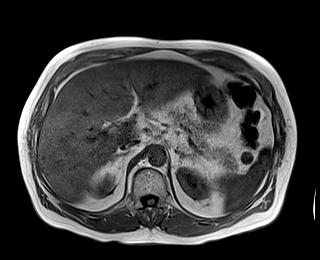
[im 88/88]
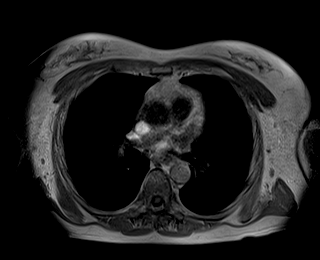

[Series 11: bSSFP · axial · 6.0mm · 0.74mm/px · 1 of 35 slices shown]
[im 1/35]
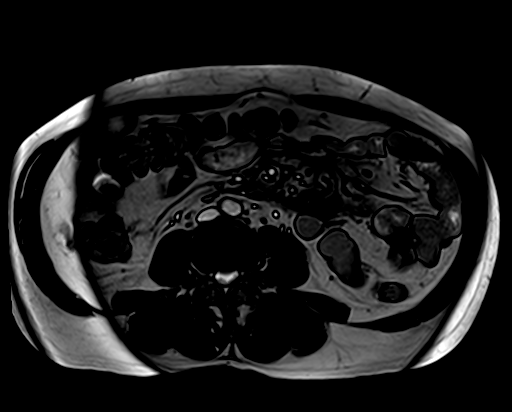

[Series 12: T1 dynamic fat-sat · axial · non-contrast · 3.0mm · 1.19mm/px · z∈[-48,+213]mm · 3 of 88 slices shown (1 of 5)]
[im 1/88]
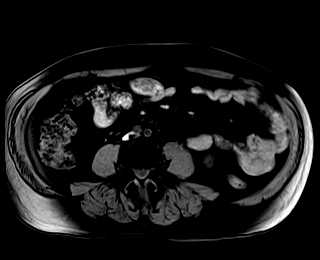
[im 44/88]
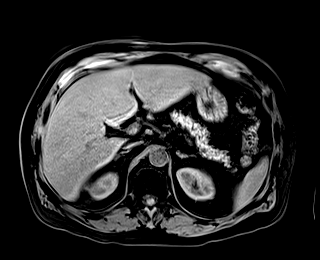
[im 88/88]
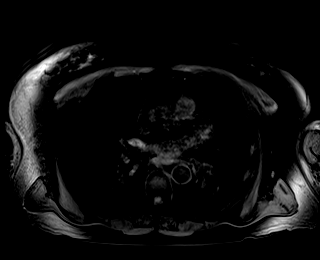

[Series 13: T1 dynamic fat-sat post-contrast · axial · 3.0mm · 1.19mm/px · z∈[-48,+213]mm · 3 of 88 slices shown (1 of 4)]
[im 1/88]
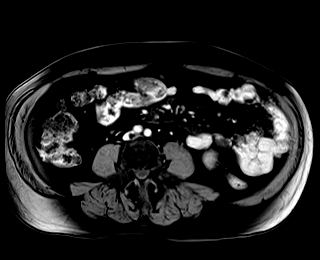
[im 44/88]
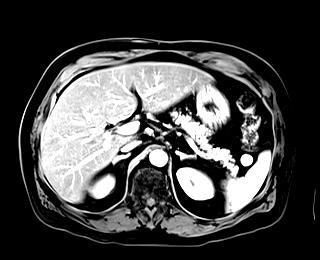
[im 88/88]
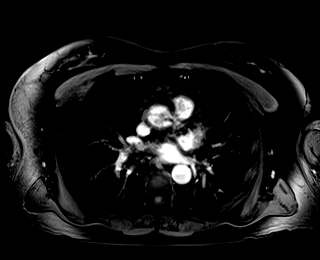

[Series 14: T1 dynamic fat-sat · axial · 3.0mm · 1.19mm/px · z∈[-48,+213]mm · 3 of 88 slices shown (2 of 5)]
[im 1/88]
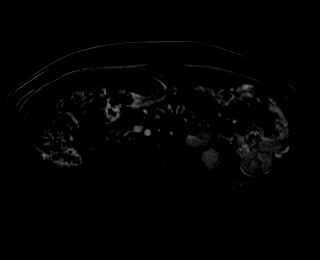
[im 44/88]
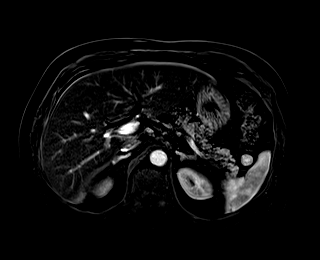
[im 88/88]
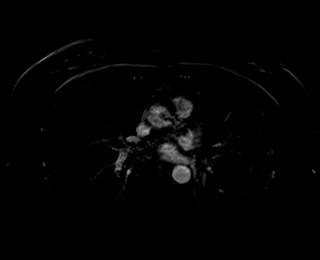

[Series 15: T1 dynamic fat-sat post-contrast · axial · 3.0mm · 1.19mm/px · z∈[-48,+213]mm · 3 of 88 slices shown (2 of 4)]
[im 1/88]
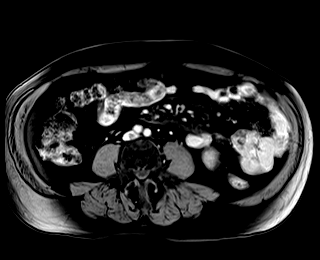
[im 44/88]
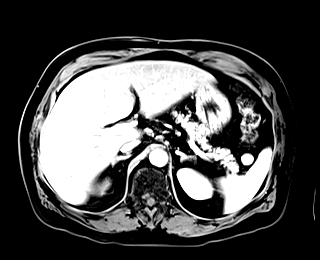
[im 88/88]
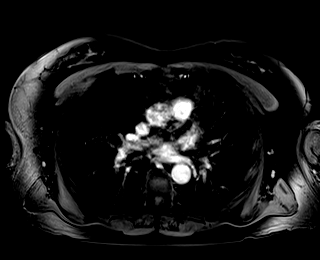

[Series 16: T1 dynamic fat-sat · axial · 3.0mm · 1.19mm/px · z∈[-48,+213]mm · 3 of 88 slices shown (3 of 5)]
[im 1/88]
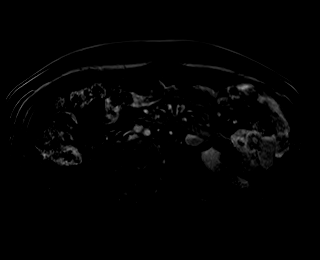
[im 44/88]
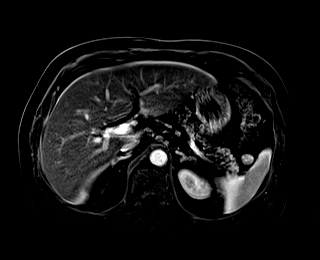
[im 88/88]
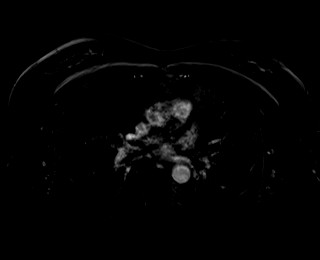

[Series 17: T1 dynamic fat-sat post-contrast · axial · 3.0mm · 1.19mm/px · z∈[-48,+213]mm · 3 of 88 slices shown (3 of 4)]
[im 1/88]
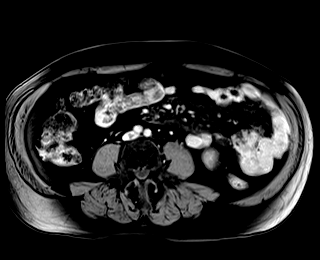
[im 44/88]
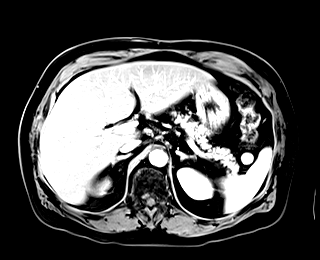
[im 88/88]
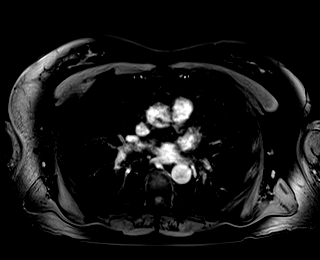

[Series 18: T1 dynamic fat-sat · axial · 3.0mm · 1.19mm/px · z∈[-48,+213]mm · 3 of 88 slices shown (4 of 5)]
[im 1/88]
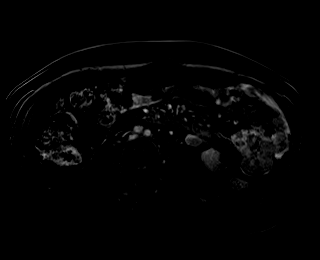
[im 44/88]
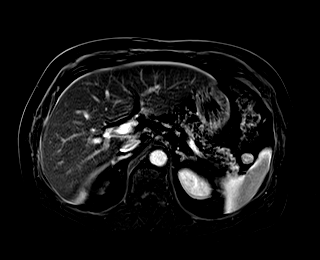
[im 88/88]
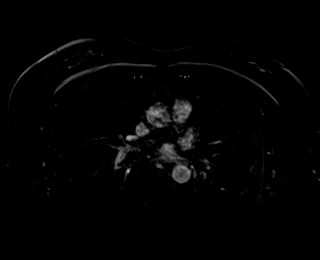

[Series 19: T1 dynamic post-contrast · coronal · 3.0mm · 1.31mm/px · 3 of 72 slices shown]
[im 1/72]
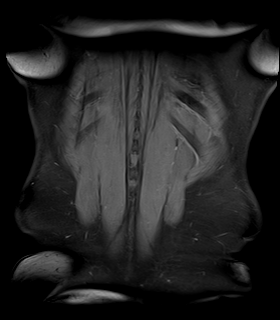
[im 36/72]
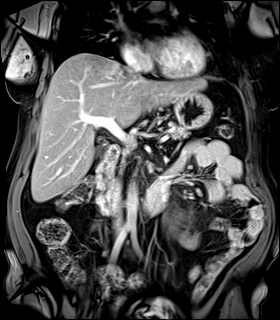
[im 72/72]
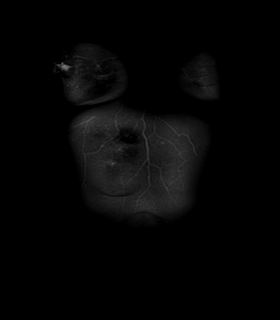

[Series 20: T1 dynamic fat-sat post-contrast · axial · 3.0mm · 1.19mm/px · z∈[-48,+213]mm · 3 of 88 slices shown (4 of 4)]
[im 1/88]
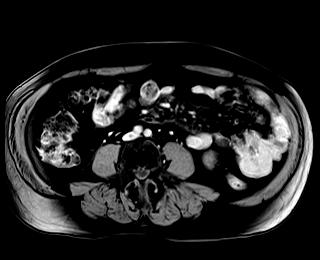
[im 44/88]
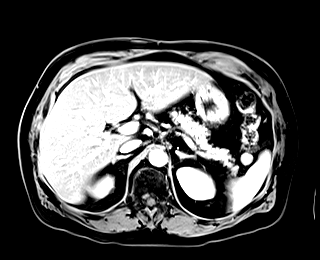
[im 88/88]
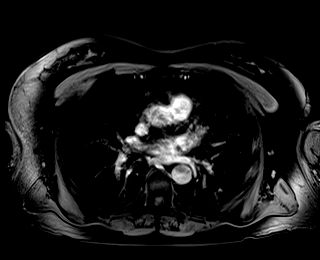

[Series 21: T1 dynamic fat-sat · axial · 3.0mm · 1.19mm/px · z∈[-48,+213]mm · 3 of 88 slices shown (5 of 5)]
[im 1/88]
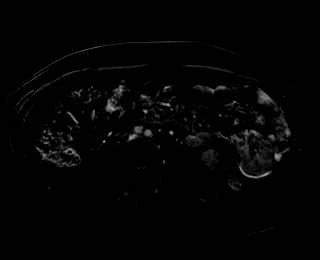
[im 44/88]
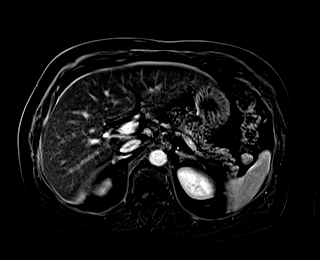
[im 88/88]
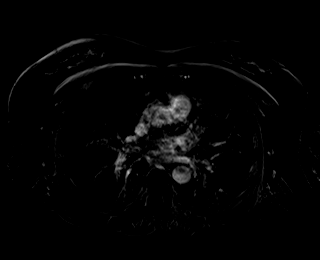

[48 of 48 positions shown; findings below may reference images not displayed]

FINDINGS: Lower chest: No acute findings.

Hepatobiliary: Liver is enlarged measuring 20.1 cm in length.
Evidence of hepatic steatosis. No suspicious hepatic mass
visualized. Gallbladder appears normal. No biliary ductal
dilatation.

Pancreas: No mass, inflammatory changes, or other parenchymal
abnormality identified.

Spleen:  Within normal limits in size and appearance.

Adrenals/Urinary Tract: Bilateral 1.5 cm adrenal nodules which do
not demonstrate definite out of phase signal dropout to confirm
adenoma. Kidneys appear normal.

Stomach/Bowel: No evidence of bowel obstruction.

Vascular/Lymphatic: There is hazy fat stranding in the central
mesentery with accompanying small lymph nodes which measure up to 6
mm in short axis. No abdominal aortic aneurysm visualized.

Other:  No ascites.

Musculoskeletal: No suspicious bony lesions identified.
IMPRESSION: 1. No suspicious hepatic mass identified.
2. Hepatomegaly and evidence of hepatic steatosis.
3. Bilateral indeterminate 1.5 cm adrenal nodules. Consider adrenal
washout CT.
4. Nonspecific mesenteric fat stranding with accompanying small
lymph nodes. Can be seen with mesenteric panniculitis,
reactive/inflammatory changes, lymphomatous disease, idiopathic.

## 2021-12-17 MED ORDER — GADOBUTROL 1 MMOL/ML IV SOLN
7.0000 mL | Freq: Once | INTRAVENOUS | Status: AC | PRN
  Administered 2021-12-17: 13:00:00 7 mL via INTRAVENOUS

## 2021-12-29 ENCOUNTER — Encounter: Payer: Self-pay | Admitting: Internal Medicine

## 2021-12-29 ENCOUNTER — Inpatient Hospital Stay: Payer: Commercial Managed Care - PPO | Attending: Internal Medicine | Admitting: Internal Medicine

## 2021-12-29 ENCOUNTER — Inpatient Hospital Stay: Payer: Commercial Managed Care - PPO

## 2021-12-29 ENCOUNTER — Other Ambulatory Visit: Payer: Self-pay

## 2021-12-29 NOTE — Progress Notes (Signed)
Patient denies new problems/concerns today.   °

## 2021-12-29 NOTE — Progress Notes (Signed)
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    Comment: Hereditary hemochromatosis is an autosomal recessive disorder of iron metabolism that varies in clinical severity. Two mutations in the HFE gene, p.C282Y (p.Cys282Tyr) and p.H63D (p.His63Asp), are associated with a clinical diagnosis of hemochromatosis. In European Caucasians, about 80-90% of individuals with clinical symptoms of primary hemochromatosis are homozygous for the C282Y mutation. Individuals with both C282Y and H63D mutations (compound heterozygotes, C282Y/H63D) may develop clinical symptoms of hemochromatosis. Patients with a single C282Y or H63D mutation (heterozygotes) or two H63D mutations (H63D/H63D homozygotes) generally do not develop clinical symptoms of hemochromatosis. Disease presentation, however, can be affected by mutations in the HFE gene or other genes involved in iron metabolism    Family Hx of Liver disease or cancer: brother liver cancer; and also Hx of hemochromatosis. Personal Hx of liver disease: none; Hepatitis B/C: None; Alcohol: rare  HISTORY OF PRESENTING ILLNESS: Ambulating independently.  Accompanied by husband.  Melanie Morales 61 y.o.  female with hereditary hemochromatosis/elevated ferritin is here today with results of her MRI liver.  Patient denies any symptoms.   Review of Systems  Constitutional:  Negative for chills, diaphoresis, fever, malaise/fatigue and weight loss.  HENT:  Negative for nosebleeds and sore throat.   Eyes:  Negative for double vision.  Respiratory:  Negative for cough, hemoptysis, sputum production, shortness of breath and wheezing.   Cardiovascular:  Negative for chest pain, palpitations, orthopnea and  leg swelling.  Gastrointestinal:  Negative for abdominal pain, blood in stool, constipation, diarrhea, heartburn, melena, nausea and vomiting.  Genitourinary:  Negative for dysuria, frequency and urgency.  Musculoskeletal:  Positive for joint pain. Negative for back pain.  Skin: Negative.  Negative for itching and rash.  Neurological:  Negative for dizziness, tingling, focal weakness, weakness and headaches.  Endo/Heme/Allergies:  Does not bruise/bleed easily.  Psychiatric/Behavioral:  Negative for depression. The patient is not nervous/anxious and does not have insomnia.    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    GERD (gastroesophageal reflux disease)     SURGICAL HISTORY: Past Surgical History:  Procedure Laterality Date   APPENDECTOMY     COLONOSCOPY WITH PROPOFOL N/A 09/24/2017   Procedure: COLONOSCOPY WITH PROPOFOL;  Surgeon: Lollie Sails, MD;  Location: [facility] ENDOSCOPY;  Service: Endoscopy;  Laterality: N/A;   ESOPHAGOGASTRODUODENOSCOPY (EGD) WITH PROPOFOL N/A 07/26/2015   Procedure: ESOPHAGOGASTRODUODENOSCOPY (EGD) WITH PROPOFOL;  Surgeon: Lollie Sails, MD;  Location: [facility] ENDOSCOPY;  Service: Endoscopy;  Laterality: N/A;   ESOPHAGOGASTRODUODENOSCOPY (EGD) WITH PROPOFOL N/A 09/24/2017   Procedure: ESOPHAGOGASTRODUODENOSCOPY (EGD) WITH PROPOFOL;  Surgeon: Lollie Sails, MD;  Location: [facility] ENDOSCOPY;  Service: Endoscopy;  Laterality: N/A;   left carpal tunnel release      SOCIAL HISTORY: Social History   Socioeconomic History   Marital status: Single    Spouse name: Not on file   Number of children: Not on file   Years of education: Not on file   Highest education level: Not on file  Occupational History   Not on file  Tobacco Use   Smoking status: Former    Packs/day: 0.50    Types:  Cigarettes    Quit date:  10/31/2019    Years since quitting: 2.1   Smokeless tobacco: Never  Substance and Sexual Activity   Alcohol use: No   Drug use: No   Sexual activity: Not on file  Other Topics Concern   Not on file  Social History Narrative   Rare alcohol; used to work at [employer]; quit in NOV, 2018 [smoked 30 years]. Lives in [place].    Social Determinants of Health   Financial Resource Strain: Not on file  Food Insecurity: Not on file  Transportation Needs: Not on file  Physical Activity: Not on file  Stress: Not on file  Social Connections: Not on file  Intimate Partner Violence: Not on file    FAMILY HISTORY: Family History  Problem Relation Age of Onset   Rectal cancer Sister    Liver cancer Brother    Head & neck cancer Brother    Breast cancer Paternal Aunt 39    ALLERGIES:  is allergic to ace inhibitors.  MEDICATIONS:  Current Outpatient Medications  Medication Sig Dispense Refill   amitriptyline (ELAVIL) 100 MG tablet Take 100 mg by mouth at bedtime.     FLUoxetine (PROZAC) 20 MG capsule Take 20 mg by mouth daily.     fluticasone (FLONASE) 50 MCG/ACT nasal spray Place into the nose.     metFORMIN (GLUCOPHAGE-XR) 500 MG 24 hr tablet Take by mouth.     omeprazole (PRILOSEC) 20 MG capsule Take 20 mg by mouth daily.     pramipexole (MIRAPEX) 0.25 MG tablet Take 0.25 mg by mouth at bedtime.     pravastatin (PRAVACHOL) 20 MG tablet Take 20 mg by mouth daily.     valsartan (DIOVAN) 80 MG tablet Take by mouth.     oxybutynin (DITROPAN) 5 MG tablet Take 5 mg by mouth 3 (three) times daily. (Patient not taking: Reported on 12/29/2021)     No current facility-administered medications for this visit.      PHYSICAL EXAMINATION:   Vitals:   12/29/21 1419  BP: 127/66  Pulse: 86  Resp: 16  Temp: 98.7 F (37.1 C)   Filed Weights   12/29/21 1419  Weight: 157 lb 3.2 oz (71.3 kg)    Physical Exam Vitals and nursing note reviewed.  HENT:     Head: Normocephalic and atraumatic.      Mouth/Throat:     Pharynx: Oropharynx is clear.  Eyes:     Extraocular Movements: Extraocular movements intact.     Pupils: Pupils are equal, round, and reactive to light.  Cardiovascular:     Rate and Rhythm: Normal rate and regular rhythm.  Pulmonary:     Comments: Decreased breath sounds bilaterally.  Abdominal:     Palpations: Abdomen is soft.  Musculoskeletal:        General: Normal range of motion.     Cervical back: Normal range of motion.  Skin:    General: Skin is warm.  Neurological:     General: No focal deficit present.     Mental Status: She is alert and oriented to person, place, and time.  Psychiatric:        Behavior: Behavior normal.        Judgment: Judgment normal.    LABORATORY DATA:  I have reviewed the data as listed Lab Results  Component Value Date   WBC 8.0 11/21/2021   HGB 13.1 11/21/2021   HCT 38.7 11/21/2021   MCV 98.7 11/21/2021   PLT 276  11/21/2021   Recent Labs    11/21/21 1154  NA 136  K 4.6  CL 99  CO2 28  GLUCOSE 88  BUN 15  CREATININE 0.79  CALCIUM 9.7  GFRNONAA >60  PROT 7.3  ALBUMIN 4.4  AST 35  ALT 44  ALKPHOS 92  BILITOT <0.1*     MR LIVER W WO CONTRAST  Result Date: 12/18/2021 CLINICAL DATA:  Suspected liver metastases. History of breast cancer. EXAM: MRI ABDOMEN WITHOUT AND WITH CONTRAST TECHNIQUE: Multiplanar multisequence MR imaging of the abdomen was performed both before and after the administration of intravenous contrast. CONTRAST:  35mL GADAVIST GADOBUTROL 1 MMOL/ML IV SOLN COMPARISON:  Abdominal ultrasound 11/19/2020 FINDINGS: Lower chest: No acute findings. Hepatobiliary: Liver is enlarged measuring 20.1 cm in length. Evidence of hepatic steatosis. No suspicious hepatic mass visualized. Gallbladder appears normal. No biliary ductal dilatation. Pancreas: No mass, inflammatory changes, or other parenchymal abnormality identified. Spleen:  Within normal limits in size and appearance. Adrenals/Urinary Tract:  Bilateral 1.5 cm adrenal nodules which do not demonstrate definite out of phase signal dropout to confirm adenoma. Kidneys appear normal. Stomach/Bowel: No evidence of bowel obstruction. Vascular/Lymphatic: There is hazy fat stranding in the central mesentery with accompanying small lymph nodes which measure up to 6 mm in short axis. No abdominal aortic aneurysm visualized. Other:  No ascites. Musculoskeletal: No suspicious bony lesions identified. IMPRESSION: 1. No suspicious hepatic mass identified. 2. Hepatomegaly and evidence of hepatic steatosis. 3. Bilateral indeterminate 1.5 cm adrenal nodules. Consider adrenal washout CT. 4. Nonspecific mesenteric fat stranding with accompanying small lymph nodes. Can be seen with mesenteric panniculitis, reactive/inflammatory changes, lymphomatous disease, idiopathic. Electronically Signed   By: Ofilia Neas M.D.   On: 12/18/2021 13:21    Hereditary hemochromatosis ([place]) #Patient is homozygous C 282Y;DEC  2022 -saturation 39% ferritin 286.  No evidence of organ dysfunction- JAN 2023-MRI liver no evidence of any iron overload/or cirrhosis.  Fatty liver changes noted.  #Fatty liver-MRI-recommend weight loss/vegetables; lean meat etc.  # DISPOSITION: # no phlebotomy  # follow up in 6 months- MD; labs- cbc/bmp;iron studies/ferritin- possible phlebotomy; - Dr.B  # I reviewed the blood work- with the patient in detail; also reviewed the imaging independently [as summarized above]; and with the patient in detail.      All questions were answered. The patient knows to call the clinic with any problems, questions or concerns.      Cammie Sickle, MD 12/29/2021 2:54 PM

## 2021-12-29 NOTE — Progress Notes (Signed)
Pt does not require phlebotomy today per MD.

## 2021-12-29 NOTE — Assessment & Plan Note (Addendum)
#  Patient is homozygous C 282Y;DEC  2022 -saturation 39% ferritin 286.  No evidence of organ dysfunction- JAN 2023-MRI liver no evidence of any iron overload/or cirrhosis.  Fatty liver changes noted.  #Fatty liver-MRI-recommend weight loss/vegetables; lean meat etc.  # DISPOSITION: # no phlebotomy  # follow up in 6 months- MD; labs- cbc/bmp;iron studies/ferritin- possible phlebotomy; - Dr.B  # I reviewed the blood work- with the patient in detail; also reviewed the imaging independently [as summarized above]; and with the patient in detail.

## 2022-02-02 ENCOUNTER — Other Ambulatory Visit: Payer: Self-pay | Admitting: Pediatrics

## 2022-02-02 DIAGNOSIS — Z1231 Encounter for screening mammogram for malignant neoplasm of breast: Secondary | ICD-10-CM

## 2022-02-20 ENCOUNTER — Other Ambulatory Visit: Payer: Self-pay | Admitting: Orthopedic Surgery

## 2022-02-20 ENCOUNTER — Ambulatory Visit
Admission: RE | Admit: 2022-02-20 | Discharge: 2022-02-20 | Disposition: A | Discharge status: Home / Self Care | Payer: Commercial Managed Care - PPO | Source: Ambulatory Visit | Attending: Orthopedic Surgery | Admitting: Orthopedic Surgery

## 2022-02-20 DIAGNOSIS — S46211A Strain of muscle, fascia and tendon of other parts of biceps, right arm, initial encounter: Secondary | ICD-10-CM

## 2022-02-20 IMAGING — MR MR ELBOW*R* W/O CM
7 series · 40 of 40 positions shown · non-contrast
Comparison: None available. Reports from outside radiographs of the
elbow and humerus [DATE] are correlated.

CLINICAL DATA: Lifting injury 4 weeks ago. Suspected biceps tendon
rupture.

EXAM:
MRI OF THE RIGHT ELBOW WITHOUT CONTRAST
TECHNIQUE: Multiplanar, multisequence MR imaging of the elbow was performed. No
intravenous contrast was administered.

[Series 5: T1 · axial · right · 3.0mm · 0.47mm/px · z∈[+12,+107]mm · 5 of 25 slices shown (1 of 2)]
[im 1/25]
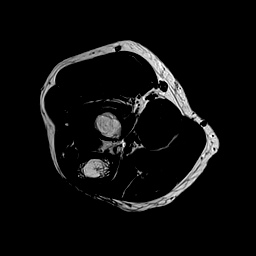
[im 7/25]
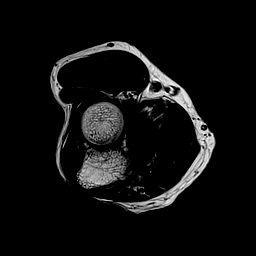
[im 13/25]
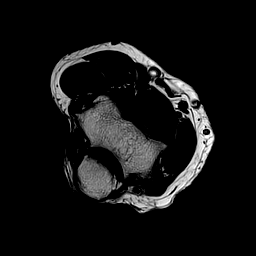
[im 19/25]
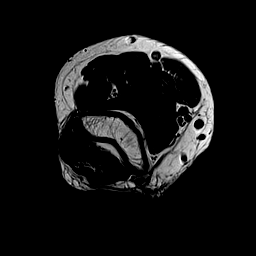
[im 25/25]
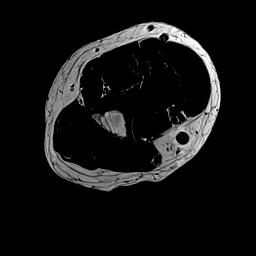

[Series 6: T2 fat-sat · axial · right · 3.0mm · 0.47mm/px · z∈[+12,+107]mm · 5 of 25 slices shown (1 of 3)]
[im 1/25]
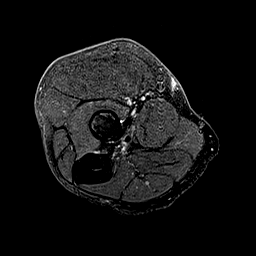
[im 7/25]
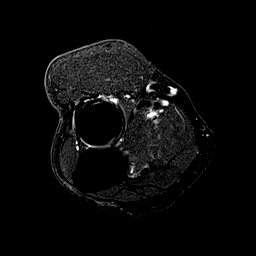
[im 13/25]
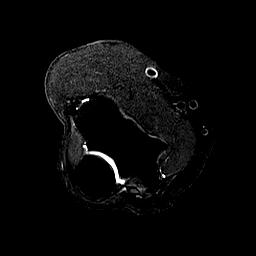
[im 19/25]
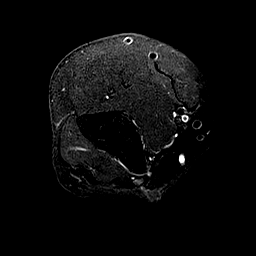
[im 25/25]
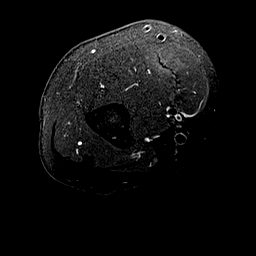

[Series 7: T2 fat-sat · oblique · right · 3.0mm · 0.55mm/px · 6 of 24 slices shown (2 of 3)]
[im 1/24]
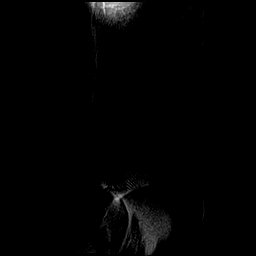
[im 5/24]
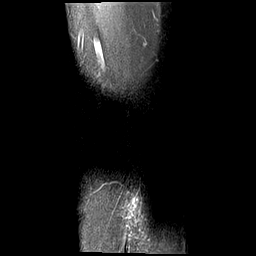
[im 10/24]
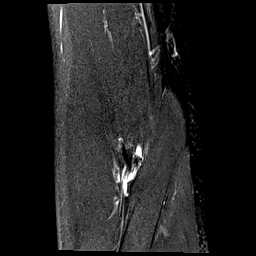
[im 14/24]
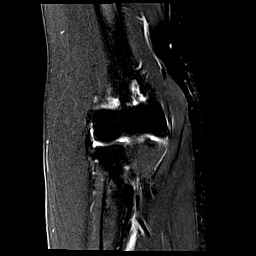
[im 19/24]
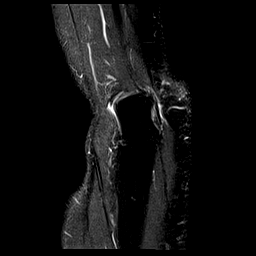
[im 24/24]
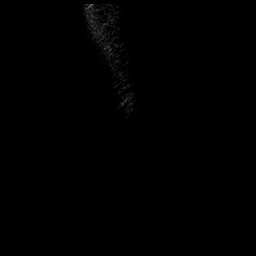

[Series 8: T1 · oblique · right · 3.0mm · 0.55mm/px · 6 of 25 slices shown (2 of 2)]
[im 1/25]
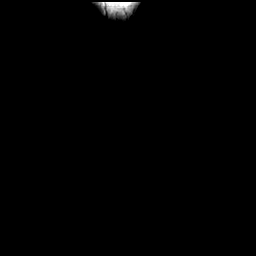
[im 5/25]
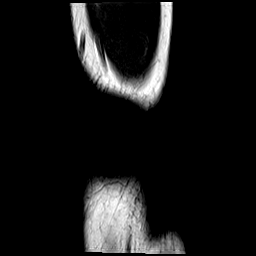
[im 10/25]
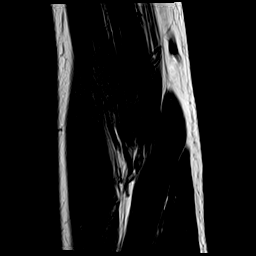
[im 15/25]
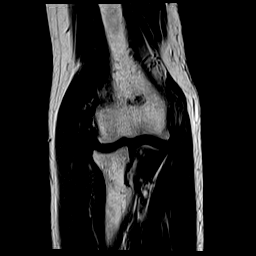
[im 20/25]
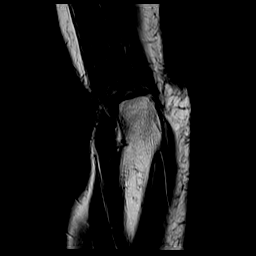
[im 25/25]
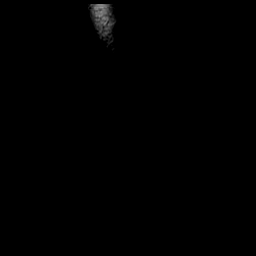

[Series 11: T2 fat-sat · sagittal · right · 3.0mm · 0.55mm/px · 6 of 25 slices shown (3 of 3)]
[im 1/25]
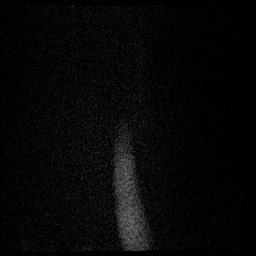
[im 5/25]
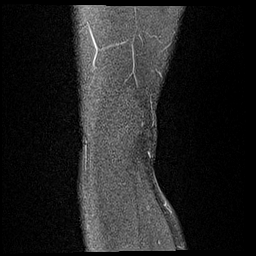
[im 10/25]
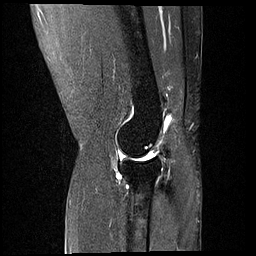
[im 15/25]
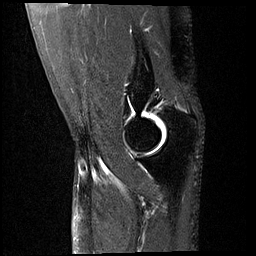
[im 20/25]
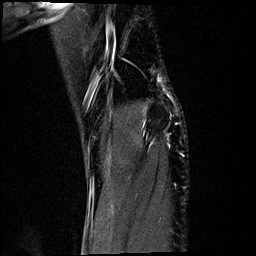
[im 25/25]
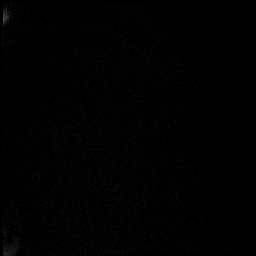

[Series 15: (person_name)1 long axis · sagittal · right · 3.0mm · 0.55mm/px · 6 of 25 slices shown]
[im 1/25]
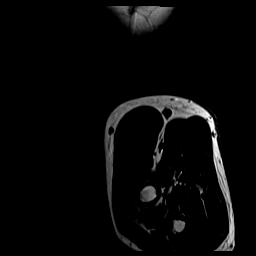
[im 5/25]
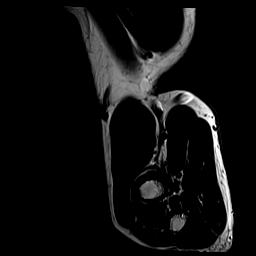
[im 10/25]
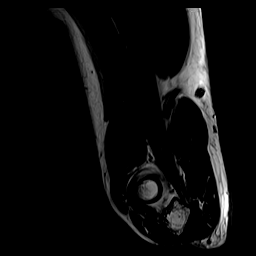
[im 15/25]
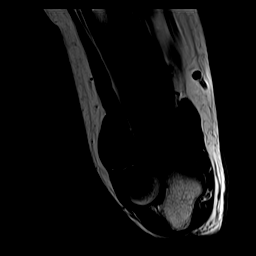
[im 20/25]
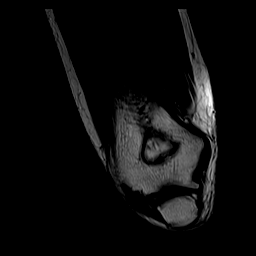
[im 25/25]
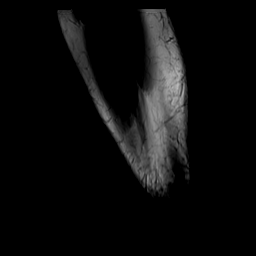

[Series 16: (person_name) axis · sagittal · right · 3.0mm · 0.55mm/px · 6 of 25 slices shown]
[im 1/25]
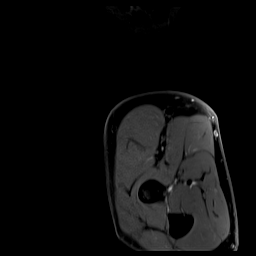
[im 5/25]
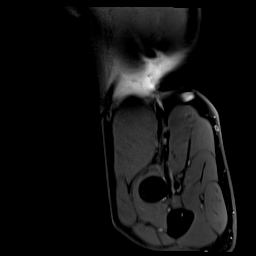
[im 10/25]
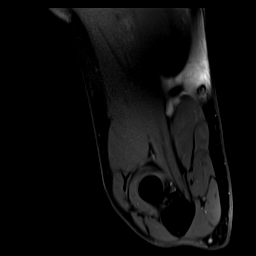
[im 15/25]
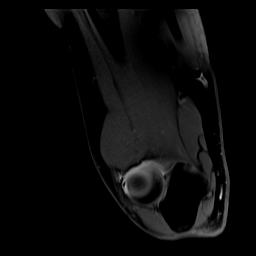
[im 20/25]
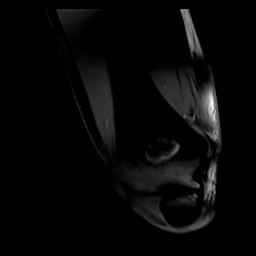
[im 25/25]
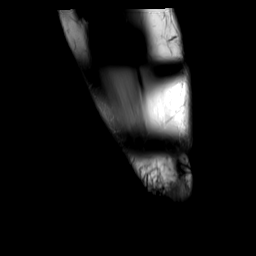

[40 of 40 positions shown; findings below may reference images not displayed]

FINDINGS: TENDONS

Common forearm flexor origin: Intact with normal signal.

Common forearm extensor origin: Tendinosis with partial tearing. No
full-thickness tendon tear or retraction.

Biceps: Intact.

Triceps: Intact with normal signal.

LIGAMENTS

Medial stabilizers: Intact.

Lateral stabilizers: The lateral ulnar and radial collateral
ligaments appear grossly intact.

Cartilage: Mild chondral thinning and degenerative subchondral cyst
formation posteriorly in the capitellum. No focal chondral defect.

Joint: Minimal joint fluid.  No evidence of loose body.

Cubital tunnel: Unremarkable.  The ulnar nerve appears normal.

Bones: No acute or significant extra-articular osseous findings.

Other: Mild subcutaneous edema posteromedially.
IMPRESSION: 1. Intact biceps tendon without significant tendinosis.
2. Common extensor tendinosis and partial tearing consistent with
lateral epicondylitis.
3. The additional elbow tendons and ligaments appear normal.
4. Mild degenerative changes with trace joint effusion. No acute
osseous findings.

## 2022-03-10 ENCOUNTER — Other Ambulatory Visit: Payer: Self-pay

## 2022-03-10 ENCOUNTER — Ambulatory Visit
Admission: RE | Admit: 2022-03-10 | Discharge: 2022-03-10 | Disposition: A | Discharge status: Home / Self Care | Payer: Commercial Managed Care - PPO | Source: Ambulatory Visit | Attending: Pediatrics | Admitting: Pediatrics

## 2022-03-10 DIAGNOSIS — Z1231 Encounter for screening mammogram for malignant neoplasm of breast: Secondary | ICD-10-CM | POA: Diagnosis present

## 2022-05-07 DIAGNOSIS — E119 Type 2 diabetes mellitus without complications: Secondary | ICD-10-CM | POA: Insufficient documentation

## 2022-06-29 ENCOUNTER — Ambulatory Visit: Payer: Commercial Managed Care - PPO | Admitting: Internal Medicine

## 2022-06-29 ENCOUNTER — Other Ambulatory Visit: Payer: Commercial Managed Care - PPO

## 2022-09-03 DIAGNOSIS — F411 Generalized anxiety disorder: Secondary | ICD-10-CM | POA: Insufficient documentation

## 2023-03-19 ENCOUNTER — Encounter: Payer: Self-pay | Admitting: *Deleted

## 2023-03-22 ENCOUNTER — Encounter: Admission: RE | Payer: Self-pay | Source: Home / Self Care

## 2023-03-22 ENCOUNTER — Encounter: Payer: Self-pay | Admitting: Anesthesiology

## 2023-03-22 ENCOUNTER — Ambulatory Visit: Admission: RE | Admit: 2023-03-22 | Payer: Commercial Managed Care - PPO | Source: Home / Self Care

## 2023-03-22 HISTORY — DX: Nonalcoholic steatohepatitis (NASH): K75.81

## 2023-03-22 SURGERY — COLONOSCOPY WITH PROPOFOL
Anesthesia: General

## 2023-03-22 MED ORDER — PROPOFOL 1000 MG/100ML IV EMUL
INTRAVENOUS | Status: AC
  Filled 2023-03-22: qty 100

## 2023-03-22 NOTE — Anesthesia Preprocedure Evaluation (Signed)
Anesthesia Evaluation  Patient identified by MRN, date of birth, ID band Patient awake    Reviewed: Allergy & Precautions, H&P , NPO status , Patient's Chart, lab work & pertinent test results, reviewed documented beta blocker date and time   Airway Mallampati: II   Neck ROM: full    Dental  (+) Poor Dentition   Pulmonary neg pulmonary ROS, former smoker   Pulmonary exam normal        Cardiovascular negative cardio ROS Normal cardiovascular exam Rhythm:regular Rate:Normal     Neuro/Psych  PSYCHIATRIC DISORDERS  Depression    negative neurological ROS     GI/Hepatic ,GERD  Medicated,,(+) Hepatitis -  Endo/Other  negative endocrine ROSdiabetes, Well Controlled    Renal/GU negative Renal ROS  negative genitourinary   Musculoskeletal   Abdominal   Peds  Hematology negative hematology ROS (+)   Anesthesia Other Findings Past Medical History: No date: Depression No date: Diabetes mellitus without complication (HCC) No date: 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 No date: GERD (gastroesophageal reflux disease) No date: NASH (nonalcoholic steatohepatitis) Past Surgical History: No date: APPENDECTOMY 09/24/2017: COLONOSCOPY WITH PROPOFOL; N/A     Comment:  Procedure: COLONOSCOPY WITH PROPOFOL;  Surgeon:               Lollie Sails, MD;  Location: [facility] ENDOSCOPY;                Service: Endoscopy;  Laterality: N/A; 07/26/2015: ESOPHAGOGASTRODUODENOSCOPY (EGD) WITH PROPOFOL; N/A     Comment:  Procedure: ESOPHAGOGASTRODUODENOSCOPY (EGD) WITH               PROPOFOL;  Surgeon: Lollie Sails, MD;  Location:               [facility] ENDOSCOPY;  Service: Endoscopy;  Laterality: N/A; 09/24/2017: ESOPHAGOGASTRODUODENOSCOPY (EGD) WITH PROPOFOL; N/A     Comment:  Procedure: ESOPHAGOGASTRODUODENOSCOPY (EGD) WITH               PROPOFOL;  Surgeon: Lollie Sails, MD;  Location:               [facility] ENDOSCOPY;   Service: Endoscopy;  Laterality: N/A; No date: left carpal tunnel release   Reproductive/Obstetrics negative OB ROS                             Anesthesia Physical Anesthesia Plan  ASA: 3  Anesthesia Plan: General   Post-op Pain Management:    Induction:   PONV Risk Score and Plan:   Airway Management Planned:   Additional Equipment:   Intra-op Plan:   Post-operative Plan:   Informed Consent: I have reviewed the patients History and Physical, chart, labs and discussed the procedure including the risks, benefits and alternatives for the proposed anesthesia with the patient or authorized representative who has indicated his/her understanding and acceptance.     Dental Advisory Given  Plan Discussed with: CRNA  Anesthesia Plan Comments:        Anesthesia Quick Evaluation

## 2023-04-29 ENCOUNTER — Encounter: Payer: Self-pay | Admitting: Ophthalmology

## 2023-04-29 NOTE — Anesthesia Preprocedure Evaluation (Addendum)
Anesthesia Evaluation  Patient identified by MRN, date of birth, ID band Patient awake    Reviewed: Allergy & Precautions, H&P , NPO status , Patient's Chart, lab work & pertinent test results  Airway Mallampati: IV  TM Distance: <3 FB Neck ROM: Full   Comment: 1 FB TMD  Dental no notable dental hx.    Pulmonary neg pulmonary ROS, Current Smoker and Patient abstained from smoking.   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  GERD (gastroesophageal reflux disease) Depression  Diabetes mellitus without complication (HCC) NASH (nonalcoholic steatohepatitis) Arthritis    Reproductive/Obstetrics negative OB ROS                             Anesthesia Physical Anesthesia Plan  ASA: 3  Anesthesia Plan: MAC   Post-op Pain Management:    Induction: Intravenous  PONV Risk Score and Plan:   Airway Management Planned: Natural Airway and Nasal Cannula  Additional Equipment:   Intra-op Plan:   Post-operative Plan:   Informed Consent: I have reviewed the patients History and Physical, chart, labs and discussed the procedure including the risks, benefits and alternatives for the proposed anesthesia with the patient or authorized representative who has indicated his/her understanding and acceptance.     Dental Advisory  Given  Plan Discussed with: Anesthesiologist, CRNA and Surgeon  Anesthesia Plan Comments: (Patient consented for risks of anesthesia including but not limited to:  - adverse reactions to medications - damage to eyes, teeth, lips or other oral mucosa - nerve damage due to positioning  - sore throat or hoarseness - Damage to heart, brain, nerves, lungs, other parts of body or loss of life  Patient voiced understanding.)        Anesthesia Quick Evaluation

## 2023-05-03 NOTE — Discharge Instructions (Signed)

## 2023-05-04 ENCOUNTER — Encounter
Admission: RE | Disposition: A | Discharge status: Home / Self Care | Payer: Self-pay | Source: Home / Self Care | Attending: Ophthalmology

## 2023-05-04 ENCOUNTER — Ambulatory Visit: Payer: Commercial Managed Care - PPO | Admitting: Anesthesiology

## 2023-05-04 ENCOUNTER — Other Ambulatory Visit: Payer: Self-pay

## 2023-05-04 ENCOUNTER — Encounter: Payer: Self-pay | Admitting: Ophthalmology

## 2023-05-04 ENCOUNTER — Ambulatory Visit
Admission: RE | Admit: 2023-05-04 | Discharge: 2023-05-04 | Disposition: A | Discharge status: Home / Self Care | Payer: Commercial Managed Care - PPO | Attending: Ophthalmology | Admitting: Ophthalmology

## 2023-05-04 DIAGNOSIS — H2511 Age-related nuclear cataract, right eye: Secondary | ICD-10-CM | POA: Diagnosis present

## 2023-05-04 DIAGNOSIS — Z7984 Long term (current) use of oral hypoglycemic drugs: Secondary | ICD-10-CM | POA: Diagnosis not present

## 2023-05-04 DIAGNOSIS — K219 Gastro-esophageal reflux disease without esophagitis: Secondary | ICD-10-CM | POA: Insufficient documentation

## 2023-05-04 DIAGNOSIS — K7581 Nonalcoholic steatohepatitis (NASH): Secondary | ICD-10-CM | POA: Insufficient documentation

## 2023-05-04 DIAGNOSIS — E1136 Type 2 diabetes mellitus with diabetic cataract: Secondary | ICD-10-CM | POA: Insufficient documentation

## 2023-05-04 DIAGNOSIS — F32A Depression, unspecified: Secondary | ICD-10-CM | POA: Diagnosis not present

## 2023-05-04 DIAGNOSIS — Z9842 Cataract extraction status, left eye: Secondary | ICD-10-CM | POA: Insufficient documentation

## 2023-05-04 DIAGNOSIS — F17210 Nicotine dependence, cigarettes, uncomplicated: Secondary | ICD-10-CM | POA: Diagnosis not present

## 2023-05-04 HISTORY — DX: Unspecified osteoarthritis, unspecified site: M19.90

## 2023-05-04 HISTORY — PX: CATARACT EXTRACTION W/PHACO: SHX586

## 2023-05-04 LAB — GLUCOSE, CAPILLARY: Glucose-Capillary: 96 mg/dL (ref 70–99)

## 2023-05-04 SURGERY — PHACOEMULSIFICATION, CATARACT, WITH IOL INSERTION
Anesthesia: Monitor Anesthesia Care | Site: Eye | Laterality: Right

## 2023-05-04 MED ORDER — MIDAZOLAM HCL 2 MG/2ML IJ SOLN
INTRAMUSCULAR | Status: DC | PRN
  Administered 2023-05-04: 2 mg via INTRAVENOUS

## 2023-05-04 MED ORDER — SIGHTPATH DOSE#1 BSS IO SOLN
INTRAOCULAR | Status: DC | PRN
  Administered 2023-05-04: 1 mL via INTRAMUSCULAR

## 2023-05-04 MED ORDER — LACTATED RINGERS IV SOLN: INTRAVENOUS | Status: DC

## 2023-05-04 MED ORDER — TETRACAINE HCL 0.5 % OP SOLN
1.0000 [drp] | OPHTHALMIC | Status: DC | PRN
  Administered 2023-05-04 (×3): 1 [drp] via OPHTHALMIC

## 2023-05-04 MED ORDER — SIGHTPATH DOSE#1 BSS IO SOLN
INTRAOCULAR | Status: DC | PRN
  Administered 2023-05-04: 45 mL via OPHTHALMIC

## 2023-05-04 MED ORDER — BRIMONIDINE TARTRATE-TIMOLOL 0.2-0.5 % OP SOLN
OPHTHALMIC | Status: DC | PRN
  Administered 2023-05-04: 1 [drp] via OPHTHALMIC

## 2023-05-04 MED ORDER — SIGHTPATH DOSE#1 NA CHONDROIT SULF-NA HYALURON 40-17 MG/ML IO SOLN
INTRAOCULAR | Status: DC | PRN
  Administered 2023-05-04: 1 mL via INTRAOCULAR

## 2023-05-04 MED ORDER — SIGHTPATH DOSE#1 BSS IO SOLN
INTRAOCULAR | Status: DC | PRN
  Administered 2023-05-04: 15 mL

## 2023-05-04 MED ORDER — ARMC OPHTHALMIC DILATING DROPS
1.0000 | OPHTHALMIC | Status: DC | PRN
  Administered 2023-05-04 (×3): 1 via OPHTHALMIC

## 2023-05-04 MED ORDER — MOXIFLOXACIN HCL 0.5 % OP SOLN
OPHTHALMIC | Status: DC | PRN
  Administered 2023-05-04: .2 mL via OPHTHALMIC

## 2023-05-04 SURGICAL SUPPLY — 11 items
ANGLE REVERSE CUT SHRT 25GA (CUTTER) ×1
CATARACT SUITE SIGHTPATH (MISCELLANEOUS) ×1 IMPLANT
CYSTOTOME ANGL RVRS SHRT 25G (CUTTER) ×1 IMPLANT
CYSTOTOME ANGL RVRS SHRT 25GA (CUTTER) ×1 IMPLANT
FEE CATARACT SUITE SIGHTPATH (MISCELLANEOUS) ×1 IMPLANT
GLOVE BIOGEL PI IND STRL 8 (GLOVE) ×1 IMPLANT
GLOVE SURG ENC TEXT LTX SZ8 (GLOVE) ×1 IMPLANT
LENS IOL TECNIS EYHANCE 21.0 (Intraocular Lens) IMPLANT
NDL FILTER BLUNT 18X1 1/2 (NEEDLE) ×1 IMPLANT
NEEDLE FILTER BLUNT 18X1 1/2 (NEEDLE) ×1 IMPLANT
SYR 3ML LL SCALE MARK (SYRINGE) ×1 IMPLANT

## 2023-05-04 NOTE — H&P (Signed)
[facility]   Primary Care Physician:  Orene Desanctis, MD Ophthalmologist: Dr. Druscilla Brownie  Pre-Procedure History & Physical: HPI:  Melanie Morales is a 62 y.o. female here for cataract surgery.   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 ENDOSCOPY;  Service: Endoscopy;  Laterality: N/A;   ESOPHAGOGASTRODUODENOSCOPY (EGD) WITH PROPOFOL N/A 07/26/2015   Procedure: ESOPHAGOGASTRODUODENOSCOPY (EGD) WITH PROPOFOL;  Surgeon: Christena Deem, MD;  Location: [facility] ENDOSCOPY;  Service: Endoscopy;  Laterality: N/A;   ESOPHAGOGASTRODUODENOSCOPY (EGD) WITH PROPOFOL N/A 09/24/2017   Procedure: ESOPHAGOGASTRODUODENOSCOPY (EGD) WITH PROPOFOL;  Surgeon: Christena Deem, MD;  Location: [facility] ENDOSCOPY;  Service: Endoscopy;  Laterality: N/A;   left carpal tunnel release      Prior to Admission medications   Medication Sig Start Date End Date Taking? Authorizing Provider  amitriptyline (ELAVIL) 100 MG tablet Take 100 mg by mouth at bedtime.   Yes [provider]  FLUoxetine (PROZAC) 20 MG capsule Take 20 mg by mouth daily.   Yes [provider]  fluticasone (FLONASE) 50 MCG/ACT nasal spray Place into the nose. 01/15/21  Yes [provider]  omeprazole (PRILOSEC) 20 MG capsule Take 20 mg by mouth daily.   Yes [provider]  pravastatin (PRAVACHOL) 20 MG tablet Take 20 mg by mouth daily.   Yes [provider]  valsartan (DIOVAN) 80 MG tablet Take by mouth. 11/01/20  Yes [provider]  metFORMIN (GLUCOPHAGE) 500 MG tablet Take  500 mg by mouth 2 (two) times daily with a meal. Patient not taking: Reported on 04/29/2023    [provider]  metFORMIN (GLUCOPHAGE-XR) 500 MG 24 hr tablet Take by mouth. 05/22/21 05/22/22  [provider]  oxybutynin (DITROPAN) 5 MG tablet Take 5 mg by mouth 3 (three) times daily. Patient not taking: Reported on 12/29/2021 11/05/21   [provider]  pramipexole (MIRAPEX) 0.25 MG tablet Take 0.25 mg by mouth at bedtime. Patient not taking: Reported on 04/29/2023 10/15/21   [provider]    Allergies as of 03/12/2023 - Review Complete 12/29/2021  Allergen Reaction Noted   Ace inhibitors Cough 11/01/2020    Family History  Problem Relation Age of Onset   Rectal cancer Sister    Liver cancer Brother    Head & neck cancer Brother    Breast cancer Paternal Aunt 53    Social History   Socioeconomic History   Marital status: Single    Spouse name: Not on file   Number of children: Not on file   Years of education: Not on file   Highest education level: Not on file  Occupational History   Not on file  Tobacco Use   Smoking status: Every Day    Packs/day: .5    Types: Cigarettes    Last attempt to quit: 10/31/2019    Years since quitting: 3.5   Smokeless tobacco: Never  Substance and Sexual Activity   Alcohol use: Yes    Comment: occasionally   Drug use: No   Sexual activity: Not on file  Other  Topics Concern   Not on file  Social History Narrative   Rare alcohol; used to work at [employer]; quit in [employer], 2018 [smoked 30 years]. [place]    Social Determinants of Health   Financial Resource Strain: Not on file  Food Insecurity: Not on file  Transportation Needs: Not on file  Physical Activity: Not on file  Stress: Not on file  Social Connections: Not on file  Intimate Partner Violence: Not on file    Review of Systems: See HPI, otherwise negative ROS  Physical Exam: BP 134/68   Pulse 86   Temp 98.1 F (36.7 C) (Temporal)    Resp 19   Ht 5\' 6"  (1.676 m)   Wt 66.2 kg   LMP 12/25/2014   SpO2 98%   BMI 23.57 kg/m  General:   Alert, cooperative in NAD Head:  Normocephalic and atraumatic. Respiratory:  Normal work of breathing. Cardiovascular:  RRR  Impression/Plan: Melanie Morales is here for cataract surgery.  Risks, benefits, limitations, and alternatives regarding cataract surgery have been reviewed with the patient.  Questions have been answered.  All parties agreeable.   Galen Manila, MD  05/04/2023, 7:14 AM

## 2023-05-04 NOTE — Anesthesia Postprocedure Evaluation (Signed)
Anesthesia Post Note  Patient: Melanie Morales  Procedure(s) Performed: CATARACT EXTRACTION PHACO AND INTRAOCULAR LENS PLACEMENT (IOC) RIGHT DIABETIC  6.75  00:33.3 (Right: Eye)  Patient location during evaluation: PACU Anesthesia Type: MAC Level of consciousness: awake and alert Pain management: pain level controlled Vital Signs Assessment: post-procedure vital signs reviewed and stable Respiratory status: spontaneous breathing, nonlabored ventilation, respiratory function stable and patient connected to nasal cannula oxygen Cardiovascular status: stable and blood pressure returned to baseline Postop Assessment: no apparent nausea or vomiting Anesthetic complications: no   No notable events documented.   Last Vitals:  Vitals:   05/04/23 0754 05/04/23 0755  BP:  103/89  Pulse: 81 82  Resp: 19 19  Temp:  36.5 C  SpO2: 97% 97%    Last Pain:  Vitals:   05/04/23 0755  TempSrc:   PainSc: 0-No pain                 Ladene Allocca C Joron Velis

## 2023-05-04 NOTE — Transfer of Care (Signed)
Immediate Anesthesia Transfer of Care Note  Patient: Melanie Morales  Procedure(s) Performed: CATARACT EXTRACTION PHACO AND INTRAOCULAR LENS PLACEMENT (IOC) RIGHT DIABETIC  6.75  00:33.3 (Right: Eye)  Patient Location: PACU  Anesthesia Type: MAC  Level of Consciousness: awake, alert  and patient cooperative  Airway and Oxygen Therapy: Patient Spontanous Breathing and Patient connected to supplemental oxygen  Post-op Assessment: Post-op Vital signs reviewed, Patient's Cardiovascular Status Stable, Respiratory Function Stable, Patent Airway and No signs of Nausea or vomiting  Post-op Vital Signs: Reviewed and stable  Complications: No notable events documented.

## 2023-05-04 NOTE — Op Note (Signed)
PREOPERATIVE DIAGNOSIS:  Nuclear sclerotic cataract of the right eye.   POSTOPERATIVE DIAGNOSIS:  H25.11 Cataract   OPERATIVE PROCEDURE:ORPROCALL@   SURGEON:  Galen Manila, MD.   ANESTHESIA:  Anesthesiologist: Marisue Humble, MD CRNA: Domenic Moras, CRNA  1.      Managed anesthesia care. 2.      0.38ml of Shugarcaine was instilled in the eye following the paracentesis.   COMPLICATIONS:  None.   TECHNIQUE:   Stop and chop   DESCRIPTION OF PROCEDURE:  The patient was examined and consented in the preoperative holding area where the aforementioned topical anesthesia was applied to the right eye and then brought back to the Operating Room where the right eye was prepped and draped in the usual sterile ophthalmic fashion and a lid speculum was placed. A paracentesis was created with the side port blade and the anterior chamber was filled with viscoelastic. A near clear corneal incision was performed with the steel keratome. A continuous curvilinear capsulorrhexis was performed with a cystotome followed by the capsulorrhexis forceps. Hydrodissection and hydrodelineation were carried out with BSS on a blunt cannula. The lens was removed in a stop and chop  technique and the remaining cortical material was removed with the irrigation-aspiration handpiece. The capsular bag was inflated with viscoelastic and the Technis ZCB00  lens was placed in the capsular bag without complication. The remaining viscoelastic was removed from the eye with the irrigation-aspiration handpiece. The wounds were hydrated. The anterior chamber was flushed with BSS and the eye was inflated to physiologic pressure. 0.70ml of Vigamox was placed in the anterior chamber. The wounds were found to be water tight. The eye was dressed with Combigan. The patient was given protective glasses to wear throughout the day and a shield with which to sleep tonight. The patient was also given drops with which to begin a drop regimen today  and will follow-up with me in one day. Implant Name Type Inv. Item Serial No. Manufacturer Lot No. LRB No. Used Action  LENS IOL TECNIS EYHANCE 21.0 - [id number] Intraocular Lens LENS IOL TECNIS EYHANCE 21.0 [id number] [employer]  Right 1 Implanted   Procedure(s): CATARACT EXTRACTION PHACO AND INTRAOCULAR LENS PLACEMENT (IOC) RIGHT DIABETIC  6.75  00:33.3 (Right)  Electronically signed: Galen Manila 05/04/2023 7:48 AM

## 2023-05-05 ENCOUNTER — Encounter: Payer: Self-pay | Admitting: Ophthalmology

## 2023-05-06 ENCOUNTER — Encounter: Payer: Self-pay | Admitting: Ophthalmology

## 2023-05-10 NOTE — Anesthesia Preprocedure Evaluation (Addendum)
Anesthesia Evaluation  Patient identified by MRN, date of birth, ID band Patient awake and Patient confused    Reviewed: Allergy & Precautions, NPO status , Patient's Chart, lab work & pertinent test results  Airway Mallampati: IV  TM Distance: <3 FB Neck ROM: Full    Dental no notable dental hx.    Pulmonary Current Smoker and Patient abstained from smoking.   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  GERD (gastroesophageal reflux disease) Depression  Diabetes mellitus without complication NASH (nonalcoholic steatohepatitis) Arthritis    Reproductive/Obstetrics                             Anesthesia Physical Anesthesia Plan  ASA: 3  Anesthesia Plan: MAC   Post-op Pain Management:    Induction: Intravenous  PONV Risk Score and Plan:   Airway Management Planned: Natural Airway and Nasal Cannula  Additional Equipment:   Intra-op Plan:   Post-operative Plan:   Informed Consent: I have reviewed the patients History and Physical, chart, labs and discussed the procedure including the risks, benefits and alternatives for the proposed anesthesia with the patient or authorized representative who has indicated his/her understanding and acceptance.     Dental Advisory Given  Plan Discussed with: Anesthesiologist, CRNA and Surgeon  Anesthesia Plan Comments: (Patient consented for risks of anesthesia including but not limited to:  - adverse reactions to medications - damage to eyes, teeth, lips or other oral  mucosa - nerve damage due to positioning  - sore throat or hoarseness - Damage to heart, brain, nerves, lungs, other parts of body or loss of life  Patient voiced understanding.)       Anesthesia Quick Evaluation

## 2023-05-13 NOTE — Discharge Instructions (Signed)

## 2023-05-18 ENCOUNTER — Ambulatory Visit
Admission: RE | Admit: 2023-05-18 | Discharge: 2023-05-18 | Disposition: A | Discharge status: Home / Self Care | Payer: Commercial Managed Care - PPO | Attending: Ophthalmology | Admitting: Ophthalmology

## 2023-05-18 ENCOUNTER — Ambulatory Visit: Payer: Commercial Managed Care - PPO | Admitting: Anesthesiology

## 2023-05-18 ENCOUNTER — Encounter: Payer: Self-pay | Admitting: Ophthalmology

## 2023-05-18 ENCOUNTER — Encounter
Admission: RE | Disposition: A | Discharge status: Home / Self Care | Payer: Self-pay | Source: Home / Self Care | Attending: Ophthalmology

## 2023-05-18 ENCOUNTER — Other Ambulatory Visit: Payer: Self-pay

## 2023-05-18 DIAGNOSIS — E1136 Type 2 diabetes mellitus with diabetic cataract: Secondary | ICD-10-CM | POA: Diagnosis not present

## 2023-05-18 DIAGNOSIS — F17210 Nicotine dependence, cigarettes, uncomplicated: Secondary | ICD-10-CM | POA: Insufficient documentation

## 2023-05-18 DIAGNOSIS — Z79899 Other long term (current) drug therapy: Secondary | ICD-10-CM | POA: Diagnosis not present

## 2023-05-18 DIAGNOSIS — F32A Depression, unspecified: Secondary | ICD-10-CM | POA: Insufficient documentation

## 2023-05-18 DIAGNOSIS — Z7984 Long term (current) use of oral hypoglycemic drugs: Secondary | ICD-10-CM | POA: Insufficient documentation

## 2023-05-18 DIAGNOSIS — K7581 Nonalcoholic steatohepatitis (NASH): Secondary | ICD-10-CM | POA: Insufficient documentation

## 2023-05-18 DIAGNOSIS — K219 Gastro-esophageal reflux disease without esophagitis: Secondary | ICD-10-CM | POA: Insufficient documentation

## 2023-05-18 DIAGNOSIS — H2512 Age-related nuclear cataract, left eye: Secondary | ICD-10-CM | POA: Diagnosis present

## 2023-05-18 DIAGNOSIS — Z9841 Cataract extraction status, right eye: Secondary | ICD-10-CM | POA: Insufficient documentation

## 2023-05-18 HISTORY — PX: CATARACT EXTRACTION W/PHACO: SHX586

## 2023-05-18 LAB — GLUCOSE, CAPILLARY: Glucose-Capillary: 132 mg/dL — ABNORMAL HIGH (ref 70–99)

## 2023-05-18 SURGERY — PHACOEMULSIFICATION, CATARACT, WITH IOL INSERTION
Anesthesia: Monitor Anesthesia Care | Site: Eye | Laterality: Left

## 2023-05-18 MED ORDER — MIDAZOLAM HCL 2 MG/2ML IJ SOLN
INTRAMUSCULAR | Status: DC | PRN
  Administered 2023-05-18: 2 mg via INTRAVENOUS

## 2023-05-18 MED ORDER — SIGHTPATH DOSE#1 NA CHONDROIT SULF-NA HYALURON 40-17 MG/ML IO SOLN
INTRAOCULAR | Status: DC | PRN
  Administered 2023-05-18: 1 mL via INTRAOCULAR

## 2023-05-18 MED ORDER — MOXIFLOXACIN HCL 0.5 % OP SOLN
OPHTHALMIC | Status: DC | PRN
  Administered 2023-05-18: .2 mL via OPHTHALMIC

## 2023-05-18 MED ORDER — ARMC OPHTHALMIC DILATING DROPS
1.0000 | OPHTHALMIC | Status: DC | PRN
  Administered 2023-05-18 (×3): 1 via OPHTHALMIC

## 2023-05-18 MED ORDER — SIGHTPATH DOSE#1 BSS IO SOLN
INTRAOCULAR | Status: DC | PRN
  Administered 2023-05-18: 1 mL via INTRAMUSCULAR

## 2023-05-18 MED ORDER — LACTATED RINGERS IV SOLN: INTRAVENOUS | Status: DC

## 2023-05-18 MED ORDER — SIGHTPATH DOSE#1 BSS IO SOLN
INTRAOCULAR | Status: DC | PRN
  Administered 2023-05-18: 50 mL via OPHTHALMIC

## 2023-05-18 MED ORDER — BRIMONIDINE TARTRATE-TIMOLOL 0.2-0.5 % OP SOLN
OPHTHALMIC | Status: DC | PRN
  Administered 2023-05-18: 1 [drp] via OPHTHALMIC

## 2023-05-18 MED ORDER — TETRACAINE HCL 0.5 % OP SOLN
1.0000 [drp] | OPHTHALMIC | Status: DC | PRN
  Administered 2023-05-18 (×3): 1 [drp] via OPHTHALMIC

## 2023-05-18 MED ORDER — SIGHTPATH DOSE#1 BSS IO SOLN
INTRAOCULAR | Status: DC | PRN
  Administered 2023-05-18: 15 mL

## 2023-05-18 MED ORDER — FENTANYL CITRATE (PF) 100 MCG/2ML IJ SOLN
INTRAMUSCULAR | Status: DC | PRN
  Administered 2023-05-18: 50 ug via INTRAVENOUS

## 2023-05-18 SURGICAL SUPPLY — 11 items
ANGLE REVERSE CUT SHRT 25GA (CUTTER) ×1
CATARACT SUITE SIGHTPATH (MISCELLANEOUS) ×1 IMPLANT
CYSTOTOME ANGL RVRS SHRT 25G (CUTTER) ×1 IMPLANT
CYSTOTOME ANGL RVRS SHRT 25GA (CUTTER) ×1 IMPLANT
FEE CATARACT SUITE SIGHTPATH (MISCELLANEOUS) ×1 IMPLANT
GLOVE BIOGEL PI IND STRL 8 (GLOVE) ×1 IMPLANT
GLOVE SURG ENC TEXT LTX SZ8 (GLOVE) ×1 IMPLANT
LENS IOL TECNIS EYHANCE 20.5 (Intraocular Lens) IMPLANT
NDL FILTER BLUNT 18X1 1/2 (NEEDLE) ×1 IMPLANT
NEEDLE FILTER BLUNT 18X1 1/2 (NEEDLE) ×1 IMPLANT
SYR 3ML LL SCALE MARK (SYRINGE) ×1 IMPLANT

## 2023-05-18 NOTE — Transfer of Care (Signed)
Immediate Anesthesia Transfer of Care Note  Patient: Melanie Morales  Procedure(s) Performed: CATARACT EXTRACTION PHACO AND INTRAOCULAR LENS PLACEMENT (IOC) LEFT DIABETIC  4.42  00:26.9 (Left: Eye)  Patient Location: PACU  Anesthesia Type: MAC  Level of Consciousness: awake, alert  and patient cooperative  Airway and Oxygen Therapy: Patient Spontanous Breathing and Patient connected to supplemental oxygen  Post-op Assessment: Post-op Vital signs reviewed, Patient's Cardiovascular Status Stable, Respiratory Function Stable, Patent Airway and No signs of Nausea or vomiting  Post-op Vital Signs: Reviewed and stable  Complications: No notable events documented.

## 2023-05-18 NOTE — Anesthesia Postprocedure Evaluation (Signed)
Anesthesia Post Note  Patient: Melanie Morales  Procedure(s) Performed: CATARACT EXTRACTION PHACO AND INTRAOCULAR LENS PLACEMENT (IOC) LEFT DIABETIC  4.42  00:26.9 (Left: Eye)  Patient location during evaluation: PACU Anesthesia Type: MAC Level of consciousness: awake and alert Pain management: pain level controlled Vital Signs Assessment: post-procedure vital signs reviewed and stable Respiratory status: spontaneous breathing, nonlabored ventilation, respiratory function stable and patient connected to nasal cannula oxygen Cardiovascular status: stable and blood pressure returned to baseline Postop Assessment: no apparent nausea or vomiting Anesthetic complications: no   No notable events documented.   Last Vitals:  Vitals:   05/18/23 1128 05/18/23 1130  BP:  115/65  Pulse: 79 78  Resp: 12 18  Temp: 36.8 C   SpO2: 96% 96%    Last Pain:  Vitals:   05/18/23 1128  TempSrc:   PainSc: 0-No pain                 Izaia Say C Johnel Yielding

## 2023-05-18 NOTE — Op Note (Signed)
PREOPERATIVE DIAGNOSIS:  Nuclear sclerotic cataract of the left eye.   POSTOPERATIVE DIAGNOSIS:  Nuclear sclerotic cataract of the left eye.   OPERATIVE PROCEDURE:ORPROCALL@   SURGEON:  Galen Manila, MD.   ANESTHESIA:  Anesthesiologist: Marisue Humble, MD CRNA: Emeterio Reeve, CRNA  1.      Managed anesthesia care. 2.     0.4ml of Shugarcaine was instilled following the paracentesis   COMPLICATIONS:  None.   TECHNIQUE:   Stop and chop   DESCRIPTION OF PROCEDURE:  The patient was examined and consented in the preoperative holding area where the aforementioned topical anesthesia was applied to the left eye and then brought back to the Operating Room where the left eye was prepped and draped in the usual sterile ophthalmic fashion and a lid speculum was placed. A paracentesis was created with the side port blade and the anterior chamber was filled with viscoelastic. A near clear corneal incision was performed with the steel keratome. A continuous curvilinear capsulorrhexis was performed with a cystotome followed by the capsulorrhexis forceps. Hydrodissection and hydrodelineation were carried out with BSS on a blunt cannula. The lens was removed in a stop and chop  technique and the remaining cortical material was removed with the irrigation-aspiration handpiece. The capsular bag was inflated with viscoelastic and the Technis ZCB00 lens was placed in the capsular bag without complication. The remaining viscoelastic was removed from the eye with the irrigation-aspiration handpiece. The wounds were hydrated. The anterior chamber was flushed with BSS and the eye was inflated to physiologic pressure. 0.28ml Vigamox was placed in the anterior chamber. The wounds were found to be water tight. The eye was dressed with Combigan. The patient was given protective glasses to wear throughout the day and a shield with which to sleep tonight. The patient was also given drops with which to begin a drop  regimen today and will follow-up with me in one day. Implant Name Type Inv. Item Serial No. Manufacturer Lot No. LRB No. Used Action  LENS IOL TECNIS EYHANCE 20.5 - [id number] Intraocular Lens LENS IOL TECNIS EYHANCE 20.5 [id number] [employer]  Left 1 Implanted    Procedure(s): CATARACT EXTRACTION PHACO AND INTRAOCULAR LENS PLACEMENT (IOC) LEFT DIABETIC  4.42  00:26.9 (Left)  Electronically signed: Galen Manila 05/18/2023 11:25 AM

## 2023-05-18 NOTE — H&P (Signed)
[facility]   Primary Care Physician:  Orene Desanctis, MD Ophthalmologist: Dr. Eusebio Me  Pre-Procedure History & Physical: HPI:  Melanie Morales is a 61 y.o. female here for cataract surgery.   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    GERD (gastroesophageal reflux disease)    NASH (nonalcoholic steatohepatitis)     Past Surgical History:  Procedure Laterality Date   APPENDECTOMY     CATARACT EXTRACTION W/PHACO Right 05/04/2023   Procedure: CATARACT EXTRACTION PHACO AND INTRAOCULAR LENS PLACEMENT (IOC) RIGHT DIABETIC  6.75  00:33.3;  Surgeon: Galen Manila, MD;  Location: [facility];  Service: Ophthalmology;  Laterality: Right;   COLONOSCOPY WITH PROPOFOL N/A 09/24/2017   Procedure: COLONOSCOPY WITH PROPOFOL;  Surgeon: Christena Deem, MD;  Location: [facility] ENDOSCOPY;  Service: Endoscopy;  Laterality: N/A;   ESOPHAGOGASTRODUODENOSCOPY (EGD) WITH PROPOFOL N/A 07/26/2015   Procedure: ESOPHAGOGASTRODUODENOSCOPY (EGD) WITH PROPOFOL;  Surgeon: Christena Deem, MD;  Location: [facility] ENDOSCOPY;  Service: Endoscopy;  Laterality: N/A;   ESOPHAGOGASTRODUODENOSCOPY (EGD) WITH PROPOFOL N/A 09/24/2017   Procedure: ESOPHAGOGASTRODUODENOSCOPY (EGD) WITH PROPOFOL;  Surgeon: Christena Deem, MD;  Location: [facility] ENDOSCOPY;  Service: Endoscopy;  Laterality: N/A;   left carpal tunnel release      Prior to Admission medications   Medication Sig Start Date End Date Taking? Authorizing Provider  amitriptyline (ELAVIL) 100 MG tablet Take 100 mg by mouth at bedtime.   Yes [provider]  FLUoxetine (PROZAC) 20 MG capsule Take 20 mg by mouth daily.   Yes [provider]  fluticasone (FLONASE) 50 MCG/ACT nasal spray Place into the nose. 01/15/21  Yes [provider]  metFORMIN (GLUCOPHAGE) 500 MG tablet Take 500 mg by mouth 2  (two) times daily with a meal.   Yes [provider]  omeprazole (PRILOSEC) 20 MG capsule Take 20 mg by mouth daily.   Yes [provider]  pravastatin (PRAVACHOL) 20 MG tablet Take 20 mg by mouth daily.   Yes [provider]  valsartan (DIOVAN) 80 MG tablet Take by mouth. 11/01/20  Yes [provider]  metFORMIN (GLUCOPHAGE-XR) 500 MG 24 hr tablet Take by mouth. 05/22/21 05/22/22  [provider]  oxybutynin (DITROPAN) 5 MG tablet Take 5 mg by mouth 3 (three) times daily. Patient not taking: Reported on 12/29/2021 11/05/21   [provider]  pramipexole (MIRAPEX) 0.25 MG tablet Take 0.25 mg by mouth at bedtime. Patient not taking: Reported on 04/29/2023 10/15/21   [provider]    Allergies as of 03/12/2023 - Review Complete 12/29/2021  Allergen Reaction Noted   Ace inhibitors Cough 11/01/2020    Family History  Problem Relation Age of Onset   Rectal cancer Sister    Liver cancer Brother    Head & neck cancer Brother    Breast cancer Paternal Aunt 56    Social History   Socioeconomic History   Marital status: Single    Spouse name: Not on file   Number of children: Not on file   Years of education: Not on file   Highest education level: Not on file  Occupational History   Not on file  Tobacco Use   Smoking status: Every Day    Packs/day: .5    Types: Cigarettes    Last attempt to quit: 10/31/2019    Years since quitting: 3.5  Smokeless tobacco: Never  Substance and Sexual Activity   Alcohol use: Yes    Comment: occasionally   Drug use: No   Sexual activity: Not on file  Other Topics Concern   Not on file  Social History Narrative   Rare alcohol; used to work at [employer]; quit in [employer], 2018 [smoked 30 years]. [place]    Social Determinants of Health   Financial Resource Strain: Not on file  Food Insecurity: Not on file  Transportation Needs: Not on file  Physical Activity: Not on file  Stress:  Not on file  Social Connections: Not on file  Intimate Partner Violence: Not on file    Review of Systems: See HPI, otherwise negative ROS  Physical Exam: BP 125/64   Temp 99 F (37.2 C) (Temporal)   Ht 5' 5.98" (1.676 m)   Wt 66.8 kg   LMP 12/25/2014   SpO2 98%   BMI 23.77 kg/m  General:   Alert, cooperative in NAD Head:  Normocephalic and atraumatic. Respiratory:  Normal work of breathing. Cardiovascular:  RRR  Impression/Plan: Melanie Morales is here for cataract surgery.  Risks, benefits, limitations, and alternatives regarding cataract surgery have been reviewed with the patient.  Questions have been answered.  All parties agreeable.   Galen Manila, MD  05/18/2023, 11:03 AM

## 2023-05-19 ENCOUNTER — Encounter: Payer: Self-pay | Admitting: Ophthalmology

## 2023-06-21 ENCOUNTER — Encounter: Payer: Self-pay | Admitting: *Deleted

## 2023-06-28 ENCOUNTER — Ambulatory Visit
Admission: RE | Admit: 2023-06-28 | Discharge: 2023-06-28 | Disposition: A | Discharge status: Home / Self Care | Payer: Commercial Managed Care - PPO | Attending: Gastroenterology | Admitting: Gastroenterology

## 2023-06-28 ENCOUNTER — Ambulatory Visit: Payer: Commercial Managed Care - PPO | Admitting: Certified Registered Nurse Anesthetist

## 2023-06-28 ENCOUNTER — Encounter
Admission: RE | Disposition: A | Discharge status: Home / Self Care | Payer: Self-pay | Source: Home / Self Care | Attending: Gastroenterology

## 2023-06-28 DIAGNOSIS — Z9049 Acquired absence of other specified parts of digestive tract: Secondary | ICD-10-CM | POA: Diagnosis not present

## 2023-06-28 DIAGNOSIS — K5730 Diverticulosis of large intestine without perforation or abscess without bleeding: Secondary | ICD-10-CM | POA: Diagnosis not present

## 2023-06-28 DIAGNOSIS — E119 Type 2 diabetes mellitus without complications: Secondary | ICD-10-CM | POA: Insufficient documentation

## 2023-06-28 DIAGNOSIS — K640 First degree hemorrhoids: Secondary | ICD-10-CM | POA: Insufficient documentation

## 2023-06-28 DIAGNOSIS — Z7984 Long term (current) use of oral hypoglycemic drugs: Secondary | ICD-10-CM | POA: Diagnosis not present

## 2023-06-28 DIAGNOSIS — K219 Gastro-esophageal reflux disease without esophagitis: Secondary | ICD-10-CM | POA: Diagnosis not present

## 2023-06-28 DIAGNOSIS — F419 Anxiety disorder, unspecified: Secondary | ICD-10-CM | POA: Insufficient documentation

## 2023-06-28 DIAGNOSIS — F32A Depression, unspecified: Secondary | ICD-10-CM | POA: Diagnosis not present

## 2023-06-28 DIAGNOSIS — D125 Benign neoplasm of sigmoid colon: Secondary | ICD-10-CM | POA: Insufficient documentation

## 2023-06-28 DIAGNOSIS — K7581 Nonalcoholic steatohepatitis (NASH): Secondary | ICD-10-CM | POA: Insufficient documentation

## 2023-06-28 DIAGNOSIS — Z1211 Encounter for screening for malignant neoplasm of colon: Secondary | ICD-10-CM | POA: Insufficient documentation

## 2023-06-28 DIAGNOSIS — F17200 Nicotine dependence, unspecified, uncomplicated: Secondary | ICD-10-CM | POA: Insufficient documentation

## 2023-06-28 HISTORY — PX: POLYPECTOMY: SHX5525

## 2023-06-28 HISTORY — DX: Anxiety disorder, unspecified: F41.9

## 2023-06-28 HISTORY — PX: COLONOSCOPY WITH PROPOFOL: SHX5780

## 2023-06-28 HISTORY — DX: Urge incontinence: N39.41

## 2023-06-28 LAB — GLUCOSE, CAPILLARY: Glucose-Capillary: 106 mg/dL — ABNORMAL HIGH (ref 70–99)

## 2023-06-28 SURGERY — COLONOSCOPY WITH PROPOFOL
Anesthesia: General

## 2023-06-28 MED ORDER — LIDOCAINE HCL (CARDIAC) PF 100 MG/5ML IV SOSY
PREFILLED_SYRINGE | INTRAVENOUS | Status: DC | PRN
  Administered 2023-06-28: 50 mg via INTRAVENOUS

## 2023-06-28 MED ORDER — PROPOFOL 500 MG/50ML IV EMUL
INTRAVENOUS | Status: DC | PRN
  Administered 2023-06-28: 150 ug/kg/min via INTRAVENOUS

## 2023-06-28 MED ORDER — SODIUM CHLORIDE 0.9 % IV SOLN
INTRAVENOUS | Status: DC
  Administered 2023-06-28: 20 mL/h via INTRAVENOUS

## 2023-06-28 MED ORDER — PROPOFOL 10 MG/ML IV BOLUS
INTRAVENOUS | Status: DC | PRN
  Administered 2023-06-28: 60 mg via INTRAVENOUS

## 2023-06-28 NOTE — Op Note (Signed)
[facility] Patient Name: Melanie Morales Procedure Date: 06/28/2023 12:38 PM MRN: [id number] Account #: [id number] Date of Birth: Dec 24, 1960 Admit Type: Outpatient Age: 62 Room: [facility] ENDO ROOM 3 Gender: Female Note Status: Finalized Instrument Name: Nelda Marseille [id number] Procedure:             Colonoscopy Indications:           Surveillance: Personal history of adenomatous polyps                         on last colonoscopy > 5 years ago Providers:             Eather Colas MD, MD Referring MD:          Daniel Nones, MD (Referring MD) Medicines:             Monitored Anesthesia Care Complications:         No immediate complications. Estimated blood loss:                         Minimal. Procedure:             Pre-Anesthesia Assessment:                        - Prior to the procedure, a History and Physical was                         performed, and patient medications and allergies were                         reviewed. The patient is competent. The risks and                         benefits of the procedure and the sedation options and                         risks were discussed with the patient. All questions                         were answered and informed consent was obtained.                         Patient identification and proposed procedure were                         verified by the physician, the nurse, the                         anesthesiologist, the anesthetist and the technician                         in the endoscopy suite. Mental Status Examination:                         alert and oriented. Airway Examination: normal                         oropharyngeal airway and neck mobility. Respiratory  Examination: clear to auscultation. CV Examination:                         normal. Prophylactic Antibiotics: The patient does not                         require prophylactic antibiotics. Prior                          Anticoagulants: The patient has taken no anticoagulant                         or antiplatelet agents. ASA Grade Assessment: III - A                         patient with severe systemic disease. After reviewing                         the risks and benefits, the patient was deemed in                         satisfactory condition to undergo the procedure. The                         anesthesia plan was to use monitored anesthesia care                         (MAC). Immediately prior to administration of                         medications, the patient was re-assessed for adequacy                         to receive sedatives. The heart rate, respiratory                         rate, oxygen saturations, blood pressure, adequacy of                         pulmonary ventilation, and response to care were                         monitored throughout the procedure. The physical                         status of the patient was re-assessed after the                         procedure.                        After obtaining informed consent, the colonoscope was                         passed under direct vision. Throughout the procedure,                         the patient's blood pressure, pulse, and oxygen  saturations were monitored continuously. The                         Colonoscope was introduced through the anus and                         advanced to the the cecum, identified by appendiceal                         orifice and ileocecal valve. The colonoscopy was                         performed without difficulty. The patient tolerated                         the procedure well. The quality of the bowel                         preparation was good except the ascending colon was                         fair. The ileocecal valve, appendiceal orifice, and                         rectum were photographed. Findings:      The perianal and digital rectal  examinations were normal.      Scattered small-mouthed diverticula were found in the sigmoid colon,       descending colon and ascending colon.      A 3 mm polyp was found in the sigmoid colon. The polyp was sessile. The       polyp was removed with a cold snare. Resection and retrieval were       complete. Estimated blood loss was minimal.      Internal hemorrhoids were found during retroflexion. The hemorrhoids       were Grade I (internal hemorrhoids that do not prolapse).      The exam was otherwise without abnormality on direct and retroflexion       views. Impression:            - Diverticulosis in the sigmoid colon, in the                         descending colon and in the ascending colon.                        - One 3 mm polyp in the sigmoid colon, removed with a                         cold snare. Resected and retrieved.                        - Internal hemorrhoids.                        - The examination was otherwise normal on direct and                         retroflexion views. Recommendation:        - Discharge patient  to home.                        - Resume previous diet.                        - Continue present medications.                        - Await pathology results.                        - Repeat colonoscopy in 1 year because the bowel                         preparation was suboptimal.                        - Return to referring physician as previously                         scheduled. Procedure Code(s):     --- Professional ---                        [phone], Colonoscopy, flexible; with removal of                         tumor(s), polyp(s), or other lesion(s) by snare                         technique Diagnosis Code(s):     --- Professional ---                        Z86.010, Personal history of colonic polyps                        K64.0, First degree hemorrhoids                        D12.5, Benign neoplasm of sigmoid colon                         K57.30, Diverticulosis of large intestine without                         perforation or abscess without bleeding CPT copyright 2022 American Medical Association. All rights reserved. The codes documented in this report are preliminary and upon coder review may  be revised to meet current compliance requirements. Eather Colas MD, MD 06/28/2023 1:09:36 PM Number of Addenda: 0 Note Initiated On: 06/28/2023 12:38 PM Scope Withdrawal Time: 0 hours 6 minutes 33 seconds  Total Procedure Duration: 0 hours 15 minutes 45 seconds  Estimated Blood Loss:  Estimated blood loss was minimal.      [facility]

## 2023-06-28 NOTE — Anesthesia Procedure Notes (Signed)
Date/Time: 06/28/2023 12:40 PM  Performed by: Ginger Carne, CRNAPre-anesthesia Checklist: Patient identified, Emergency Drugs available, Suction available, Patient being monitored and Timeout performed Patient Re-evaluated:Patient Re-evaluated prior to induction Oxygen Delivery Method: Nasal cannula Preoxygenation: Pre-oxygenation with 100% oxygen Induction Type: IV induction

## 2023-06-28 NOTE — H&P (Signed)
Outpatient short stay form Pre-procedure 06/28/2023  Regis Bill, MD  Primary Physician: System, Provider Not In  Reason for visit:  Surveillance colonoscopy  History of present illness:    62 y/o lady with history of hypertension, DM II, and depression here for colonoscopy due to history of small SSA on last colonoscopy in 2018. No blood thinners. No family history of GI malignancies. History of appendectomy.    Current Facility-Administered Medications:    0.9 %  sodium chloride infusion, , Intravenous, Continuous, Lipa Knauff, Rossie Muskrat, MD, Last Rate: 20 mL/hr at 06/28/23 1206, Restarted at 06/28/23 1211  Medications Prior to Admission  Medication Sig Dispense Refill Last Dose   amitriptyline (ELAVIL) 100 MG tablet Take 100 mg by mouth at bedtime.   06/27/2023   FLUoxetine (PROZAC) 20 MG capsule Take 20 mg by mouth daily.   06/28/2023 at 0800   fluticasone (FLONASE) 50 MCG/ACT nasal spray Place into the nose.   Past Week   metFORMIN (GLUCOPHAGE) 500 MG tablet Take 500 mg by mouth 2 (two) times daily with a meal.   Past Week   omeprazole (PRILOSEC) 20 MG capsule Take 20 mg by mouth daily.   06/28/2023 at 0800   pravastatin (PRAVACHOL) 20 MG tablet Take 20 mg by mouth daily.   06/27/2023   solifenacin (VESICARE) 5 MG tablet Take 5 mg by mouth daily.   Past Week   valsartan (DIOVAN) 80 MG tablet Take by mouth.   06/27/2023   metFORMIN (GLUCOPHAGE-XR) 500 MG 24 hr tablet Take by mouth.      oxybutynin (DITROPAN) 5 MG tablet Take 5 mg by mouth 3 (three) times daily. (Patient not taking: Reported on 12/29/2021)      pramipexole (MIRAPEX) 0.25 MG tablet Take 0.25 mg by mouth at bedtime. 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    Physical Exam  Gen: Alert, oriented. Appears stated age.  HEENT: PERRLA. Lungs: No respiratory distress CV: RRR Abd: soft, benign, no masses Ext: No edema    Planned procedures: Proceed with colonoscopy. The patient understands the nature of the planned procedure, indications, risks, alternatives and potential complications including but not limited to bleeding, infection, perforation, damage to internal organs and possible oversedation/side effects from anesthesia. The patient agrees and gives consent to proceed.  Please refer to procedure notes for findings, recommendations and patient disposition/instructions.     Regis Bill, MD [facility]

## 2023-06-28 NOTE — Anesthesia Preprocedure Evaluation (Signed)
Anesthesia Evaluation  Patient identified by MRN, date of birth, ID band Patient awake    Reviewed: Allergy & Precautions, H&P , NPO status , Patient's Chart, lab work & pertinent test results, reviewed documented beta blocker date and time   Airway Mallampati: III   Neck ROM: full    Dental  (+) Poor Dentition   Pulmonary neg pulmonary ROS, Current SmokerPatient did not abstain from smoking.   Pulmonary exam normal        Cardiovascular Exercise Tolerance: Good negative cardio ROS Normal cardiovascular exam Rhythm:regular Rate:Normal     Neuro/Psych   Anxiety Depression    negative neurological ROS  negative psych ROS   GI/Hepatic ,GERD  Medicated,,(+) Hepatitis -  Endo/Other  negative endocrine ROSdiabetes, Well Controlled, Type 2, Oral Hypoglycemic Agents    Renal/GU negative Renal ROS  negative genitourinary   Musculoskeletal   Abdominal   Peds  Hematology negative hematology ROS (+)   Anesthesia Other Findings Past Medical History: No date: Anxiety No date: Arthritis No date: Depression No date: Diabetes mellitus without complication (HCC) No date: 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 No date: GERD (gastroesophageal reflux disease) No date: NASH (nonalcoholic steatohepatitis) No date: NASH (nonalcoholic steatohepatitis) No date: Urge incontinence Past Surgical History: No date: APPENDECTOMY No date: CARPAL TUNNEL RELEASE; Left 05/04/2023: CATARACT EXTRACTION W/PHACO; Right     Comment:  Procedure: CATARACT EXTRACTION PHACO AND INTRAOCULAR               LENS PLACEMENT (IOC) RIGHT DIABETIC  6.75  00:33.3;                Surgeon: Galen Manila, MD;  Location: [facility];  Service: Ophthalmology;  Laterality: Right; 05/18/2023: CATARACT EXTRACTION W/PHACO; Left     Comment:  Procedure: CATARACT EXTRACTION PHACO AND INTRAOCULAR               LENS PLACEMENT  (IOC) LEFT DIABETIC  4.42  00:26.9;                Surgeon: Galen Manila, MD;  Location: [facility]              [facility];  Service: Ophthalmology;  Laterality: Left; 09/24/2017: COLONOSCOPY WITH PROPOFOL; N/A     Comment:  Procedure: COLONOSCOPY WITH PROPOFOL;  Surgeon:               Christena Deem, MD;  Location: [facility] ENDOSCOPY;                Service: Endoscopy;  Laterality: N/A; 07/26/2015: ESOPHAGOGASTRODUODENOSCOPY (EGD) WITH PROPOFOL; N/A     Comment:  Procedure: ESOPHAGOGASTRODUODENOSCOPY (EGD) WITH               PROPOFOL;  Surgeon: Christena Deem, MD;  Location:               [facility] ENDOSCOPY;  Service: Endoscopy;  Laterality: N/A; 09/24/2017: ESOPHAGOGASTRODUODENOSCOPY (EGD) WITH PROPOFOL; N/A     Comment:  Procedure: ESOPHAGOGASTRODUODENOSCOPY (EGD) WITH               PROPOFOL;  Surgeon: Christena Deem, MD;  Location:               [facility] ENDOSCOPY;  Service: Endoscopy;  Laterality: N/A; No date: left carpal tunnel release BMI    Body Mass Index: 22.40 kg/m     Reproductive/Obstetrics negative OB ROS  Anesthesia Physical Anesthesia Plan  ASA: 3  Anesthesia Plan: General   Post-op Pain Management:    Induction:   PONV Risk Score and Plan:   Airway Management Planned:   Additional Equipment:   Intra-op Plan:   Post-operative Plan:   Informed Consent: I have reviewed the patients History and Physical, chart, labs and discussed the procedure including the risks, benefits and alternatives for the proposed anesthesia with the patient or authorized representative who has indicated his/her understanding and acceptance.     Dental Advisory Given  Plan Discussed with: CRNA  Anesthesia Plan Comments:        Anesthesia Quick Evaluation

## 2023-06-28 NOTE — Interval H&P Note (Signed)
History and Physical Interval Note:  06/28/2023 12:33 PM  Melanie Morales  has presented today for surgery, with the diagnosis of PH Colon Polyps.  The various methods of treatment have been discussed with the patient and family. After consideration of risks, benefits and other options for treatment, the patient has consented to  Procedure(s): COLONOSCOPY WITH PROPOFOL (N/A) as a surgical intervention.  The patient's history has been reviewed, patient examined, no change in status, stable for surgery.  I have reviewed the patient's chart and labs.  Questions were answered to the patient's satisfaction.     Regis Bill  Ok to proceed with colonoscopy

## 2023-06-28 NOTE — Transfer of Care (Signed)
Immediate Anesthesia Transfer of Care Note  Patient: Melanie Morales  Procedure(s) Performed: COLONOSCOPY WITH PROPOFOL POLYPECTOMY  Patient Location: Endoscopy Unit  Anesthesia Type:General  Level of Consciousness: awake, alert , and oriented  Airway & Oxygen Therapy: Patient Spontanous Breathing  Post-op Assessment: Report given to RN and Post -op Vital signs reviewed and stable  Post vital signs: Reviewed and stable  Last Vitals:  Vitals Value Taken Time  BP 93/57 06/28/23 1306  Temp 36.7 C 06/28/23 1305  Pulse 90 06/28/23 1306  Resp 24 06/28/23 1306  SpO2 100 % 06/28/23 1306    Last Pain:  Vitals:   06/28/23 1305  TempSrc:   PainSc: 0-No pain         Complications: No notable events documented.

## 2023-06-29 ENCOUNTER — Encounter: Payer: Self-pay | Admitting: Gastroenterology

## 2023-06-29 NOTE — Anesthesia Postprocedure Evaluation (Signed)
Anesthesia Post Note  Patient: Melanie Morales  Procedure(s) Performed: COLONOSCOPY WITH PROPOFOL POLYPECTOMY  Patient location during evaluation: PACU Anesthesia Type: General Level of consciousness: awake and alert Pain management: pain level controlled Vital Signs Assessment: post-procedure vital signs reviewed and stable Respiratory status: spontaneous breathing, nonlabored ventilation, respiratory function stable and patient connected to nasal cannula oxygen Cardiovascular status: blood pressure returned to baseline and stable Postop Assessment: no apparent nausea or vomiting Anesthetic complications: no   No notable events documented.   Last Vitals:  Vitals:   06/28/23 1315 06/28/23 1324  BP: 106/64 109/64  Pulse: 88 82  Resp: 10 18  Temp:    SpO2: 100% 100%    Last Pain:  Vitals:   06/28/23 1324  TempSrc:   PainSc: 0-No pain                 Yevette Edwards

## 2024-09-26 ENCOUNTER — Encounter: Payer: Self-pay | Admitting: Pediatrics

## 2024-09-26 ENCOUNTER — Ambulatory Visit (INDEPENDENT_AMBULATORY_CARE_PROVIDER_SITE_OTHER): Admitting: Pediatrics

## 2024-09-26 VITALS — BP 110/57 | HR 82 | Temp 98.0°F | Ht 65.0 in | Wt 146.0 lb

## 2024-09-26 DIAGNOSIS — Z1211 Encounter for screening for malignant neoplasm of colon: Secondary | ICD-10-CM

## 2024-09-26 DIAGNOSIS — Z23 Encounter for immunization: Secondary | ICD-10-CM | POA: Diagnosis not present

## 2024-09-26 DIAGNOSIS — Z1330 Encounter for screening examination for mental health and behavioral disorders, unspecified: Secondary | ICD-10-CM

## 2024-09-26 DIAGNOSIS — Z7689 Persons encountering health services in other specified circumstances: Secondary | ICD-10-CM

## 2024-09-26 DIAGNOSIS — E1165 Type 2 diabetes mellitus with hyperglycemia: Secondary | ICD-10-CM

## 2024-09-26 DIAGNOSIS — Z1231 Encounter for screening mammogram for malignant neoplasm of breast: Secondary | ICD-10-CM | POA: Diagnosis not present

## 2024-09-26 LAB — MICROALBUMIN, URINE WAIVED
Creatinine, Urine Waived: 10 mg/dL (ref 10–300)
Microalb, Ur Waived: 10 mg/L (ref 0–19)
Microalb/Creat Ratio: <30 mg/g (ref <30)

## 2024-09-26 NOTE — Progress Notes (Unsigned)
 Establish Care Note  BP (!) 110/57   Pulse 82   Temp 98 F (36.7 C) (Oral)   Ht 5' 5 (1.651 m)   Wt 146 lb (66.2 kg)   LMP 12/25/2014   SpO2 97%   BMI 24.30 kg/m    Subjective:    Patient ID: Melanie Morales, female    DOB: [birth year], 63 y.o.   MRN: [id number]  HPI: Melanie Morales is a 63 y.o. female  Chief Complaint  Patient presents with   Establish Care    Establishing care, the following was discussed today:  Discussed the use of AI scribe software for clinical note transcription with the patient, who gave verbal consent to proceed.  History of Present Illness   Melanie Morales is a 63 year old female who presents for routine follow-up and medication review.  She is currently taking amitriptyline 200 mg at night, fluoxetine 20 mg, fluticasone nasal spray, metformin 500 mg once daily, omeprazole 20 mg twice daily, pravastatin 20 mg nightly, and valsartan 80 mg daily.  She has not experienced any lightheadedness.  She has not had a recent mammogram or colonoscopy and typically sees her primary care provider every six months, although it has been a while since her last visit.  She has already received her flu shot.      Current Outpatient Medications on File Prior to Visit  Medication Sig Dispense Refill   amitriptyline (ELAVIL) 100 MG tablet Take 100 mg by mouth at bedtime.     FLUoxetine (PROZAC) 20 MG capsule Take 20 mg by mouth daily.     fluticasone (FLONASE) 50 MCG/ACT nasal spray Place into the nose.     omeprazole (PRILOSEC) 20 MG capsule Take 20 mg by mouth daily.     pravastatin (PRAVACHOL) 20 MG tablet Take 20 mg by mouth daily.     valsartan (DIOVAN) 80 MG tablet Take by mouth.     metFORMIN (GLUCOPHAGE-XR) 500 MG 24 hr tablet Take by mouth.     No current facility-administered medications on file prior to visit.    #HM Will review HM records and updated as needed.  Relevant past medical, surgical, family and social history reviewed and  updated as indicated. Interim medical history since our last visit reviewed. Allergies and medications reviewed and updated.  ROS per HPI unless specifically indicated above     Objective:    BP (!) 110/57   Pulse 82   Temp 98 F (36.7 C) (Oral)   Ht 5' 5 (1.651 m)   Wt 146 lb (66.2 kg)   LMP 12/25/2014   SpO2 97%   BMI 24.30 kg/m   Wt Readings from Last 3 Encounters:  09/26/24 146 lb (66.2 kg)  06/28/23 138 lb 12.8 oz (63 kg)  05/18/23 147 lb 3.2 oz (66.8 kg)     Physical Exam Constitutional:      Appearance: Normal appearance.  Pulmonary:     Effort: Pulmonary effort is normal.  Musculoskeletal:        General: Normal range of motion.  Skin:    Comments: Normal skin color  Neurological:     General: No focal deficit present.     Mental Status: She is alert. Mental status is at baseline.  Psychiatric:        Mood and Affect: Mood normal.        Behavior: Behavior normal.        Thought Content: Thought content normal.  09/26/2024    3:47 PM  Depression screen PHQ 2/9  Decreased Interest 0  Down, Depressed, Hopeless 0  PHQ - 2 Score 0  Altered sleeping 1  Tired, decreased energy 1  Change in appetite 0  Feeling bad or failure about yourself  0  Trouble concentrating 0  Moving slowly or fidgety/restless 0  Suicidal thoughts 0  PHQ-9 Score 2  Difficult doing work/chores Not difficult at all        09/26/2024    3:47 PM  GAD 7 : Generalized Anxiety Score  Nervous, Anxious, on Edge 0  Control/stop worrying 0  Worry too much - different things 1  Trouble relaxing 1  Restless 0  Easily annoyed or irritable 0  Afraid - awful might happen 0  Total GAD 7 Score 2  Anxiety Difficulty Not difficult at all       Assessment & Plan:  Assessment & Plan   Type 2 diabetes mellitus with hyperglycemia, without long-term current use of insulin (HCC) Assessment & Plan: Metformin prescribed for Type 2 diabetes. Blood pressure controlled. She is due  fore eye exam. On statin.  - Continue metformin 500 mg once daily. - Perform annual blood work.   Orders: -     Hemoglobin A1c -     Comprehensive metabolic panel with GFR -     Lipid panel -     Microalbumin, Urine Waived  Immunization due -     Flu vaccine HIGH DOSE PF(Fluzone Trivalent)  Encounter for screening mammogram for malignant neoplasm of breast -     3D Screening Mammogram, Left and Right; Future  Screen for colon cancer -     Ambulatory referral to Gastroenterology  Encounter to establish care Reviewed available patient record including history, medications, problem list. HM updated as able. Will review and/or request outside records (if applicable) and will fill remaining HM gaps as needed at follow up visit.  Encounter for behavioral health screening As part of their intake evaluation, the patient was screened for depression, anxiety.  PHQ9 SCORE 2, GAD7 SCORE 2. Screening results negative for tested conditions. See plan under problem/diagnosis above.    Follow up plan: Return in about 6 months (around 03/27/2025).  Melanie SHAUNNA Nett, MD

## 2024-09-26 NOTE — Patient Instructions (Signed)
 You have an order for:  []   2D Mammogram  [x]   3D Mammogram  []   Bone Density     Please call for appointment:  [facility]  [address] [place] [address] [phone] [facility] [address] [place], [place] [address] [phone] [facility] [address] [place], [place] [address] [phone]   Make sure to wear two-piece clothing.  No lotions, powders, or deodorants the day of the appointment. Make sure to bring picture ID and insurance card.  Bring list of medications you are currently taking including any supplements.   Schedule your [place] screening mammogram through MyChart!   Log into your MyChart account.  Go to 'Visit' (or 'Appointments' if on mobile App) --> Schedule an Appointment  Under 'Select a Reason for Visit' choose the Mammogram Screening option.  Complete the pre-visit questions and select the time and place that best fits your schedule.

## 2024-09-27 ENCOUNTER — Ambulatory Visit: Payer: Self-pay | Admitting: Pediatrics

## 2024-09-27 LAB — LIPID PANEL
Chol/HDL Ratio: 3.8 ratio (ref 0.0–4.4)
Cholesterol, Total: 151 mg/dL (ref 100–199)
HDL: 40 mg/dL (ref >39)
LDL Chol Calc (NIH): 83 mg/dL (ref 0–99)
Triglycerides: 165 mg/dL — ABNORMAL HIGH (ref 0–149)
VLDL Cholesterol Cal: 28 mg/dL (ref 5–40)

## 2024-09-27 LAB — HEMOGLOBIN A1C
Est. average glucose Bld gHb Est-mCnc: 146 mg/dL
Hgb A1c MFr Bld: 6.7 % — ABNORMAL HIGH (ref 4.8–5.6)

## 2024-09-27 LAB — COMPREHENSIVE METABOLIC PANEL WITH GFR
ALT: 11 IU/L (ref 0–32)
AST: 12 IU/L (ref 0–40)
Albumin: 4.3 g/dL (ref 3.9–4.9)
Alkaline Phosphatase: 115 IU/L (ref 49–135)
BUN/Creatinine Ratio: 9 — ABNORMAL LOW (ref 12–28)
BUN: 8 mg/dL (ref 8–27)
Bilirubin Total: <0.2 mg/dL (ref 0.0–1.2)
CO2: 23 mmol/L (ref 20–29)
Calcium: 9.7 mg/dL (ref 8.7–10.3)
Chloride: 100 mmol/L (ref 96–106)
Creatinine, Ser: 0.85 mg/dL (ref 0.57–1.00)
Globulin, Total: 2.3 g/dL (ref 1.5–4.5)
Glucose: 98 mg/dL (ref 70–99)
Potassium: 4.2 mmol/L (ref 3.5–5.2)
Sodium: 138 mmol/L (ref 134–144)
Total Protein: 6.6 g/dL (ref 6.0–8.5)
eGFR: 76 mL/min/1.73 (ref >59)

## 2024-09-28 ENCOUNTER — Encounter: Payer: Self-pay | Admitting: Pediatrics

## 2024-09-28 NOTE — Assessment & Plan Note (Signed)
 Metformin prescribed for Type 2 diabetes. Blood pressure controlled. She is due fore eye exam. On statin.  - Continue metformin 500 mg once daily. - Perform annual blood work.

## 2024-10-30 ENCOUNTER — Telehealth: Payer: Self-pay | Admitting: Pediatrics

## 2024-10-30 NOTE — Telephone Encounter (Signed)
 Called patient and left a message for her to call back to get scheduled for Welcome to Medicare.

## 2025-01-02 ENCOUNTER — Other Ambulatory Visit: Payer: Self-pay | Admitting: Pediatrics

## 2025-01-02 NOTE — Telephone Encounter (Signed)
 Copied from CRM [id number]. Topic: Clinical - Medication Refill >> Jan 02, 2025  4:08 PM Antwanette L wrote: Medication:  FLUoxetine (PROZAC) 20 MG capsule  fluticasone (FLONASE) 50 MCG/ACT nasal spray metFORMIN (GLUCOPHAGE-XR) 500 MG 24 hr tablet  omeprazole (PRILOSEC) 20 MG capsule pravastatin (PRAVACHOL) 20 MG tablet valsartan (DIOVAN) 80 MG tablet  Has the patient contacted their pharmacy? No   This is the patient's preferred pharmacy:  [id number] - [place], [place] - [address] [place] [address] Phone: [phone] Fax: [phone]  Is this the correct pharmacy for this prescription? Yes   Has the prescription been filled recently? No  Is the patient out of the medication? No. The patient be will be out of the medicine on 01/07/24  Has the patient been seen for an appointment in the last year OR does the patient have an upcoming appointment? Yes. Last office visit with Dr. Herold was 09/26/24 and patient has transfer of care appt on 08/29/25 with Dr. Duwaine Louder  Can we respond through MyChart? No.Patient can be reached at [phone]  Agent: Please be advised that Rx refills may take up to 3 business days. We ask that you follow-up with your pharmacy.

## 2025-01-03 ENCOUNTER — Other Ambulatory Visit: Payer: Self-pay

## 2025-01-03 NOTE — Telephone Encounter (Signed)
 Requested medication (s) are due for refill today: routing for review  Requested medication (s) are on the active medication list: no  Last refill:  07/25/15  Future visit scheduled: {Yes  Notes to clinic:  Historical medication     Requested Prescriptions  Pending Prescriptions Disp Refills   FLUoxetine (PROZAC) 20 MG capsule      Sig: Take 1 capsule (20 mg total) by mouth daily.     Psychiatry:  Antidepressants - SSRI Passed - 01/03/2025  3:25 PM      Passed - Completed PHQ-2 or PHQ-9 in the last 360 days      Passed - Valid encounter within last 6 months    Recent Outpatient Visits           3 months ago Type 2 diabetes mellitus with hyperglycemia, without long-term current use of insulin (HCC)   [place] [facility] Herold Hadassah SQUIBB, MD               fluticasone [facility]) 50 MCG/ACT nasal spray       Ear, Nose, and Throat: Nasal Preparations - Corticosteroids Passed - 01/03/2025  3:25 PM      Passed - Valid encounter within last 12 months    Recent Outpatient Visits           3 months ago Type 2 diabetes mellitus with hyperglycemia, without long-term current use of insulin [facility])   [place] [facility] Herold Hadassah SQUIBB, MD               metFORMIN (GLUCOPHAGE-XR) 500 MG 24 hr tablet      Sig: Take by mouth.     Endocrinology:  Diabetes - Biguanides Failed - 01/03/2025  3:25 PM      Failed - B12 Level in normal range and within 720 days    No results found for: VITAMINB12       Failed - CBC within normal limits and completed in the last 12 months    WBC  Date Value Ref Range Status  11/21/2021 8.0 4.0 - 10.5 K/uL Final   RBC  Date Value Ref Range Status  11/21/2021 3.92 3.87 - 5.11 MIL/uL Final   Hemoglobin  Date Value Ref Range Status  11/21/2021 13.1 12.0 - 15.0 g/dL Final   HGB  Date Value Ref Range Status  03/20/2014 13.2 12.0 - 16.0 g/dL Final   HCT  Date Value Ref Range Status  11/21/2021 38.7 36.0 - 46.0  % Final  03/20/2014 38.7 35.0 - 47.0 % Final   MCHC  Date Value Ref Range Status  11/21/2021 33.9 30.0 - 36.0 g/dL Final   [facility]  Date Value Ref Range Status  11/21/2021 33.4 26.0 - 34.0 pg Final   MCV  Date Value Ref Range Status  11/21/2021 98.7 80.0 - 100.0 fL Final  03/20/2014 97 80 - 100 fL Final   No results found for: PLTCOUNTKUC, LABPLAT, POCPLA RDW  Date Value Ref Range Status  11/21/2021 11.5 11.5 - 15.5 % Final  03/20/2014 12.0 11.5 - 14.5 % Final         Passed - Cr in normal range and within 360 days    Creatinine, Ser  Date Value Ref Range Status  09/26/2024 0.85 0.57 - 1.00 mg/dL Final         Passed - HBA1C is between 0 and 7.9 and within 180 days    Hgb A1c MFr Bld  Date Value Ref Range Status  09/26/2024 6.7 (H) 4.8 - 5.6 % Final    Comment:             Prediabetes: 5.7 - 6.4          Diabetes: >6.4          Glycemic control for adults with diabetes: <7.0          Passed - eGFR in normal range and within 360 days    GFR, Estimated  Date Value Ref Range Status  11/21/2021 >60 >60 mL/min Final    Comment:    (NOTE) Calculated using the CKD-EPI Creatinine Equation (2021)    eGFR  Date Value Ref Range Status  09/26/2024 76 >59 mL/min/1.73 Final         Passed - Valid encounter within last 6 months    Recent Outpatient Visits           3 months ago Type 2 diabetes mellitus with hyperglycemia, without long-term current use of insulin (HCC)   [place] [facility] Herold Hadassah SQUIBB, MD               omeprazole (PRILOSEC) 20 MG capsule      Sig: Take 1 capsule (20 mg total) by mouth daily.     Gastroenterology: Proton Pump Inhibitors Passed - 01/03/2025  3:25 PM      Passed - Valid encounter within last 12 months    Recent Outpatient Visits           3 months ago Type 2 diabetes mellitus with hyperglycemia, without long-term current use of insulin [facility])   [place] [facility] Herold Hadassah SQUIBB, MD                pravastatin (PRAVACHOL) 20 MG tablet      Sig: Take 1 tablet (20 mg total) by mouth daily.     Cardiovascular:  Antilipid - Statins Failed - 01/03/2025  3:25 PM      Failed - Lipid Panel in normal range within the last 12 months    Cholesterol, Total  Date Value Ref Range Status  09/26/2024 151 100 - 199 mg/dL Final   LDL Chol Calc (NIH)  Date Value Ref Range Status  09/26/2024 83 0 - 99 mg/dL Final   HDL  Date Value Ref Range Status  09/26/2024 40 >39 mg/dL Final   Triglycerides  Date Value Ref Range Status  09/26/2024 165 (H) 0 - 149 mg/dL Final         Passed - Patient is not pregnant      Passed - Valid encounter within last 12 months    Recent Outpatient Visits           3 months ago Type 2 diabetes mellitus with hyperglycemia, without long-term current use of insulin (HCC)   [place] [facility] Herold Hadassah SQUIBB, MD               valsartan (DIOVAN) 80 MG tablet      Sig: Take by mouth.     Cardiovascular:  Angiotensin Receptor Blockers Passed - 01/03/2025  3:25 PM      Passed - Cr in normal range and within 180 days    Creatinine, Ser  Date Value Ref Range Status  09/26/2024 0.85 0.57 - 1.00 mg/dL Final         Passed - K in normal range and within 180 days    Potassium  Date Value Ref Range Status  09/26/2024 4.2 3.5 - 5.2 mmol/L Final         Passed - Patient is not pregnant      Passed - Last BP in normal range    BP Readings from Last 1 Encounters:  09/26/24 (!) 110/57         Passed - Valid encounter within last 6 months    Recent Outpatient Visits           3 months ago Type 2 diabetes mellitus with hyperglycemia, without long-term current use of insulin [facility])   [place] [facility] Herold Hadassah SQUIBB, MD

## 2025-01-03 NOTE — Telephone Encounter (Signed)
 Copied from CRM [id number]. Topic: Clinical - Medication Refill >> Jan 02, 2025  4:08 PM Antwanette L wrote: Medication:  FLUoxetine (PROZAC) 20 MG capsule  fluticasone (FLONASE) 50 MCG/ACT nasal spray metFORMIN (GLUCOPHAGE-XR) 500 MG 24 hr tablet  omeprazole (PRILOSEC) 20 MG capsule pravastatin (PRAVACHOL) 20 MG tablet valsartan (DIOVAN) 80 MG tablet  Has the patient contacted their pharmacy? No   This is the patient's preferred pharmacy:  [id number] - [place], [place] - [address] [place] [address] Phone: [phone] Fax: [phone]  Is this the correct pharmacy for this prescription? Yes   Has the prescription been filled recently? No  Is the patient out of the medication? No. The patient be will be out of the medicine on 01/07/24  Has the patient been seen for an appointment in the last year OR does the patient have an upcoming appointment? Yes. Last office visit with Dr. Herold was 09/26/24 and patient has transfer of care appt on 08/29/25 with Dr. Duwaine Louder  Can we respond through MyChart? No.Patient can be reached at [phone]  Agent: Please be advised that Rx refills may take up to 3 business days. We ask that you follow-up with your pharmacy. >> Jan 02, 2025  4:16 PM Antwanette L wrote: The patient is requesting an increased dosage of pravastatin. According to the chart, the current dose is 20?mg, but the patient is requesting 40?mg

## 2025-01-03 NOTE — Telephone Encounter (Signed)
 Can we call the patient and move her [facility] appointment with Dr. Vicci up sooner? Patient was last seen in October and was advised to follow up in April.

## 2025-01-03 NOTE — Telephone Encounter (Signed)
 Scheduled for 3/3 patient will not have insurance until some time in February

## 2025-01-04 NOTE — Telephone Encounter (Signed)
 Previous Dr. Herold patient. Has TOC appointment in March. Prescriptions t'd up for 60 day supplies to get patient to scheduled appointment in March.

## 2025-01-07 NOTE — Telephone Encounter (Signed)
 I don't have doseages that she has been taking as we've never written these- please confirm doseages of medication and I'll get her enough to make it to her appointment.

## 2025-01-08 ENCOUNTER — Other Ambulatory Visit: Payer: Self-pay

## 2025-01-08 DIAGNOSIS — E1165 Type 2 diabetes mellitus with hyperglycemia: Secondary | ICD-10-CM

## 2025-01-08 MED ORDER — PRAVASTATIN SODIUM 20 MG PO TABS: 20.0000 mg | ORAL_TABLET | Freq: Every day | ORAL | 0 refills | Status: AC

## 2025-01-08 MED ORDER — FLUOXETINE HCL 20 MG PO CAPS: 20.0000 mg | ORAL_CAPSULE | Freq: Every day | ORAL | 0 refills | Status: AC

## 2025-01-08 MED ORDER — METFORMIN HCL ER 500 MG PO TB24: 500.0000 mg | ORAL_TABLET | Freq: Every day | ORAL | 0 refills | Status: AC

## 2025-01-08 MED ORDER — OMEPRAZOLE 20 MG PO CPDR: 20.0000 mg | DELAYED_RELEASE_CAPSULE | Freq: Every day | ORAL | 0 refills | Status: AC

## 2025-01-08 MED ORDER — FLUTICASONE PROPIONATE 50 MCG/ACT NA SUSP: 2.0000 | Freq: Every day | NASAL | 0 refills | Status: AC

## 2025-01-08 MED ORDER — VALSARTAN 80 MG PO TABS: 80.0000 mg | ORAL_TABLET | Freq: Every day | ORAL | 0 refills | Status: AC

## 2025-01-08 NOTE — Telephone Encounter (Signed)
 Called and clarified all medications with the patient. Medications t'd up for provider and pended for refill. Also confirmed the pharmacy with the patient.

## 2025-01-08 NOTE — Progress Notes (Signed)
 "  01/08/2025 Name: Melanie Morales MRN: [id number] DOB: 30-May-1961  Chief Complaint  Patient presents with   Medication Management   Melanie Morales is a 64 y.o. year old female who presented for a telephone visit.   They were referred to the pharmacist by a quality report for assistance in managing diabetes.   Subjective:  Care Team: Primary Care Provider: Herold Hadassah SQUIBB, MD (Inactive) ; Next Scheduled Visit: 02/20/25  Medication Access/Adherence  Current Pharmacy:  [id number] - [place], [place] - [address] [place] [address] Phone: [phone] Fax: [phone]  Patient reports affordability concerns with their medications: No  Patient reports access/transportation concerns to their pharmacy: No  Patient reports adherence concerns with their medications:  No    Diabetes: Current medications: metformin  XR 500mg  -Patient appeared in quality report for risk of failing adherence metric for metformin  in 2025 -She endorses taking 500mg  daily, and a refill for this was sent in today (along with her other maintenance medications to get her through till her [facility] visit with Dr. Vicci in March) -Fill history does reflect this had not been filled since a 30 day supply on 9/2 and again 9/29 until today -Fill report does reflect good adherence to all other maintenance medications, though; and it appears metformin  did not have refills that could have been filled until now -Patient states she does check home BG regularly, and these readings are normal, but she does not provide values  Macrovascular and Microvascular Risk Reduction:  Statin? yes (pravastatin  40mg  daily); ACEi/ARB? yes (valsartan  80mg  daily) Last urinary albumin/creatinine ratio:  Lab Results  Component Value Date   MICRALBCREAT <30 09/26/2024   Last eye exam:   Last foot exam: No foot exam found Tobacco Use:  Tobacco Use: High Risk (09/28/2024)   Patient History    Smoking Tobacco Use: Every  Day    Smokeless Tobacco Use: Never    Passive Exposure: Not on file   Objective: Lab Results  Component Value Date   HGBA1C 6.7 (H) 09/26/2024   Lab Results  Component Value Date   CREATININE 0.85 09/26/2024   BUN 8 09/26/2024   NA 138 09/26/2024   K 4.2 09/26/2024   CL 100 09/26/2024   CO2 23 09/26/2024   Lab Results  Component Value Date   CHOL 151 09/26/2024   HDL 40 09/26/2024   LDLCALC 83 09/26/2024   TRIG 165 (H) 09/26/2024   CHOLHDL 3.8 09/26/2024   Medications Reviewed Today     Reviewed by Deanna Channing LABOR, RPH (Pharmacist) on 01/08/25 at 1507  Med List Status: <None>   Medication Order Taking? Sig Documenting Provider Last Dose Status Informant  amitriptyline (ELAVIL) 100 MG tablet [id number]  Take 100 mg by mouth at bedtime. [provider]  Active   FLUoxetine  (PROZAC ) 20 MG capsule [id number] Yes Take 1 capsule (20 mg total) by mouth daily. Johnson, Megan P, DO  Active   fluticasone  (FLONASE ) 50 MCG/ACT nasal spray [id number] Yes Place 2 sprays into both nostrils daily. Johnson, Megan P, DO  Active   metFORMIN  (GLUCOPHAGE -XR) 500 MG 24 hr tablet [id number] Yes Take 1 tablet (500 mg total) by mouth daily with breakfast. Vicci Bouchard P, DO  Active   omeprazole  (PRILOSEC) 20 MG capsule [id number] Yes Take 1 capsule (20 mg total) by mouth daily. Johnson, Megan P, DO  Active   pravastatin  (PRAVACHOL ) 20 MG tablet [id number] Yes Take 1 tablet (20 mg total) by mouth daily.  Johnson, Megan P, DO  Active   valsartan  (DIOVAN ) 80 MG tablet [id number] Yes Take 1 tablet (80 mg total) by mouth daily. Johnson, Megan P, DO  Active            Assessment/Plan:   Diabetes: -Currently controlled; goal A1c <7%. Cardiorenal risk reduction is optimized.. Blood pressure is at goal <130/80. LDL is not at goal.  -Continue metformin  XR 500mg  daily -Patient will be due for A1c at upcoming visit -Consider increasing pravastatin  to 40mg  daily or changing to atorvastatin or  rosuvastatin to try to obtain LDL <70 -If BP consistently low, may need to consider decreasing valsartan  dose  Channing DELENA Mealing, PharmD, DPLA    "

## 2025-02-20 ENCOUNTER — Encounter: Admitting: Family Medicine

## 2025-03-27 ENCOUNTER — Ambulatory Visit: Admitting: Pediatrics

## 2025-08-29 ENCOUNTER — Encounter: Admitting: Family Medicine
